# Patient Record
Sex: Male | Born: 1975
Health system: Southern US, Community
[De-identification: ages and names within clinical notes are randomized; demographics above are authoritative.]

## PROBLEM LIST (undated history)

## (undated) DIAGNOSIS — I10 Essential (primary) hypertension: Secondary | ICD-10-CM

## (undated) DIAGNOSIS — F419 Anxiety disorder, unspecified: Secondary | ICD-10-CM

## (undated) DIAGNOSIS — T7840XA Allergy, unspecified, initial encounter: Secondary | ICD-10-CM

## (undated) DIAGNOSIS — E785 Hyperlipidemia, unspecified: Secondary | ICD-10-CM

## (undated) DIAGNOSIS — K219 Gastro-esophageal reflux disease without esophagitis: Secondary | ICD-10-CM

## (undated) HISTORY — DX: Anxiety disorder, unspecified: F41.9

## (undated) HISTORY — PX: OTHER SURGICAL HISTORY: SHX169

## (undated) HISTORY — DX: Gastro-esophageal reflux disease without esophagitis: K21.9

## (undated) HISTORY — DX: Hyperlipidemia, unspecified: E78.5

## (undated) HISTORY — DX: Allergy, unspecified, initial encounter: T78.40XA

## (undated) HISTORY — DX: Essential (primary) hypertension: I10

---

## 2010-07-05 ENCOUNTER — Ambulatory Visit: Payer: Self-pay | Admitting: Internal Medicine

## 2010-07-05 DIAGNOSIS — F17200 Nicotine dependence, unspecified, uncomplicated: Secondary | ICD-10-CM

## 2010-07-05 DIAGNOSIS — K644 Residual hemorrhoidal skin tags: Secondary | ICD-10-CM | POA: Insufficient documentation

## 2010-07-05 LAB — CONVERTED CEMR LAB
Cholesterol: 210 mg/dL — ABNORMAL HIGH (ref 0–200)
HDL: 41.9 mg/dL (ref >39.00)
Triglycerides: 187 mg/dL — ABNORMAL HIGH (ref 0.0–149.0)
VLDL: 37.4 mg/dL (ref 0.0–40.0)

## 2010-07-06 ENCOUNTER — Encounter: Payer: Self-pay | Admitting: Internal Medicine

## 2010-08-26 NOTE — Assessment & Plan Note (Signed)
Summary: NEW / [facility] / # cd   Vital Signs:  Patient profile:   35 year old male Height:      72 inches Weight:      179 pounds BMI:     24.36 O2 Sat:      96 % on Room air Temp:     98.4 degrees F oral Pulse rate:   73 / minute Pulse rhythm:   regular Resp:     16 per minute BP sitting:   116 / 80  (left arm) Cuff size:   regular  Vitals Entered By: Bill Salinas CMA (July 05, 2010 1:34 PM)  O2 Flow:  Room air CC: new pt here to est care with primary, Preventive Care   Primary Care Provider:  Yetta Barre  CC:  new pt here to est care with primary and Preventive Care.  History of Present Illness: New to me for a complete physical - no complaints today.  Preventive Screening-Counseling & Management  Alcohol-Tobacco     Alcohol drinks/day: 0     Alcohol Counseling: not indicated; patient does not drink     Smoking Status: current     Smoking Cessation Counseling: yes     Smoke Cessation Stage: precontemplative     Packs/Day: 3 to 4 cigs a day     Year Started: 1991     Pack years: 5     Tobacco Counseling: to quit use of tobacco products  Caffeine-Diet-Exercise     Caffeine use/day: 3 cups per day     Does Patient Exercise: yes  Hep-HIV-STD-Contraception     Hepatitis Risk: no risk noted     HIV Risk: no risk noted     STD Risk: no risk noted     Dental Visit-last 6 months yes     Dental Care Counseling: to seek dental care; no dental care within six months     TSE monthly: yes     Testicular SE Education/Counseling to perform regular STE     Sun Exposure-Excessive: no     Sun Exposure Counseling: not indicated; sun exposure is acceptable  Safety-Violence-Falls     Seat Belt Use: yes     Helmet Use: n/a     Firearms in the Home: no firearms in the home     Smoke Detectors: yes     Violence in the Home: no risk noted     Sexual Abuse: no      Sexual History:  currently monogamous.        Drug Use:  no.        Blood Transfusions:  no.    Clinical  Review Panels:  Immunizations   Last Tetanus Booster:  Tdap (07/05/2010)   Last Flu Vaccine:  Fluvax 3+ (07/05/2010)  Diabetes Management   Last Flu Vaccine:  Fluvax 3+ (07/05/2010)   Medications Prior to Update: 1)  None  Current Medications (verified): 1)  Multiple Vitamins  Tabs (Multiple Vitamin) .Marland Kitchen.. 1 Tablet Once A Day  Allergies (verified): No Known Drug Allergies  Past History:  Past Medical History: Unremarkable  Past Surgical History: Denies surgical history  Family History: none  Social History: Occupation: CSR Single Current Smoker Alcohol use-yes Drug use-no Regular exercise-yes Smoking Status:  current Packs/Day:  3 to 4 cigs a day Caffeine use/day:  3 cups per day Does Patient Exercise:  yes Sun Exposure-Excessive:  no Seat Belt Use:  yes Hepatitis Risk:  no risk noted HIV Risk:  no risk  noted STD Risk:  no risk noted Dental Care w/in 6 mos.:  yes Sexual History:  currently monogamous Blood Transfusions:  no Drug Use:  no  Review of Systems  The patient denies anorexia, fever, weight loss, weight gain, chest pain, syncope, dyspnea on exertion, peripheral edema, prolonged cough, headaches, hemoptysis, abdominal pain, melena, hematochezia, severe indigestion/heartburn, hematuria, muscle weakness, suspicious skin lesions, transient blindness, difficulty walking, depression, enlarged lymph nodes, angioedema, and testicular masses.    Physical Exam  General:  alert, well-developed, well-nourished, well-hydrated, appropriate dress, normal appearance, healthy-appearing, cooperative to examination, and good hygiene.   Head:  normocephalic, atraumatic, no abnormalities observed, and no abnormalities palpated.   Eyes:  vision grossly intact, pupils equal, pupils round, pupils reactive to light, and no injection.   Ears:  R ear normal and L ear normal.   Nose:  External nasal examination shows no deformity or inflammation. Nasal mucosa are pink and  moist without lesions or exudates. Mouth:  Oral mucosa and oropharynx without lesions or exudates.  Teeth in good repair. Neck:  supple, full ROM, no masses, no thyromegaly, no thyroid nodules or tenderness, no JVD, normal carotid upstroke, no carotid bruits, no cervical lymphadenopathy, and no neck tenderness.   Breasts:  no gynecomastia, no masses, and no adenopathy.   Lungs:  normal respiratory effort, no intercostal retractions, no accessory muscle use, normal breath sounds, no dullness, no fremitus, no crackles, and no wheezes.   Heart:  normal rate, regular rhythm, no murmur, no gallop, no rub, and no JVD.   Abdomen:  soft, non-tender, normal bowel sounds, no distention, no masses, no guarding, no rigidity, no rebound tenderness, no abdominal hernia, no inguinal hernia, no hepatomegaly, and no splenomegaly.   Rectal:  no external abnormalities, normal sphincter tone, no masses, no tenderness, no fissures, no fistulae, no perianal rash, and external hemorrhoid(s).   Genitalia:  circumcised, no hydrocele, no varicocele, no scrotal masses, no testicular masses or atrophy, no cutaneous lesions, and no urethral discharge.   Prostate:  Prostate gland firm and smooth, no enlargement, nodularity, tenderness, mass, asymmetry or induration. Msk:  normal ROM, no joint tenderness, no joint swelling, no joint warmth, no redness over joints, no joint deformities, no joint instability, and no crepitation.   Pulses:  R and L carotid,radial,femoral,dorsalis pedis and posterior tibial pulses are full and equal bilaterally Extremities:  No clubbing, cyanosis, edema, or deformity noted with normal full range of motion of all joints.   Neurologic:  No cranial nerve deficits noted. Station and gait are normal. Plantar reflexes are down-going bilaterally. DTRs are symmetrical throughout. Sensory, motor and coordinative functions appear intact. Skin:  turgor normal, color normal, no rashes, no suspicious lesions, no  ecchymoses, no petechiae, no purpura, no ulcerations, and no edema.   Cervical Nodes:  no anterior cervical adenopathy and no posterior cervical adenopathy.   Axillary Nodes:  no R axillary adenopathy and no L axillary adenopathy.   Inguinal Nodes:  no R inguinal adenopathy and no L inguinal adenopathy.   Psych:  Cognition and judgment appear intact. Alert and cooperative with normal attention span and concentration. No apparent delusions, illusions, hallucinations   Impression & Recommendations:  Problem # 1:  ROUTINE GENERAL MEDICAL EXAM@HEALTH  CARE FACL (ICD-V70.0) Assessment New  Orders: Venipuncture (30865) TLB-Lipid Panel (80061-LIPID)  Td Booster: Tdap (07/05/2010)   Flu Vax: Fluvax 3+ (07/05/2010)    Discussed using sunscreen, use of alcohol, drug use, self testicular exam, routine dental care, routine eye care, routine physical exam,  seat belts, multiple vitamins,  and recommendations for immunizations.  Discussed exercise and checking cholesterol.  Discussed gun safety, safe sex, and contraception.   Complete Medication List: 1)  Multiple Vitamins Tabs (Multiple vitamin) .Marland Kitchen.. 1 tablet once a day  Other Orders: Tdap => 74yrs IM (91478) Admin 1st Vaccine (29562) Flu Vaccine 33yrs + (13086) TB Skin Test [id number]) Admin of Any Addtl Vaccine (96295) Admin of Any Addtl Vaccine (28413)  Colorectal Screening:  Current Recommendations:    Hemoccult: NEG X 1 today  Immunization & Chemoprophylaxis:    Tetanus vaccine: Tdap  (07/05/2010)    Influenza vaccine: Fluvax 3+  (07/05/2010)  Patient Instructions: 1)  Please schedule a follow-up appointment in 2 months. 2)  Tobacco is very bad for your health and your loved ones! You Should stop smoking!. 3)  Stop Smoking Tips: Choose a Quit date. Cut down before the Quit date. decide what you will do as a substitute when you feel the urge to smoke(gum,toothpick,exercise). 4)  It is important that you exercise regularly at least 20  minutes 5 times a week. If you develop chest pain, have severe difficulty breathing, or feel very tired , stop exercising immediately and seek medical attention. 5)  You need to lose weight. Consider a lower calorie diet and regular exercise.    Orders Added: 1)  Venipuncture [24401] 2)  TLB-Lipid Panel [80061-LIPID] 3)  Tdap => 31yrs IM [90715] 4)  Admin 1st Vaccine [90471] 5)  Flu Vaccine 80yrs + [02725] 6)  TB Skin Test [86580] 7)  Admin of Any Addtl Vaccine [90472] 8)  Admin of Any Addtl Vaccine [90472] 9)  New Patient 18-39 years [99385]   Immunizations Administered:  Tetanus Vaccine:    Vaccine Type: Tdap    Site: left deltoid    Mfr: GlaxoSmithKline    Dose: 0.5 ml    Route: IM    Given by: Ami Bullins CMA    Exp. Date: 05/13/2012    Lot #: [id number]    VIS given: 06/11/08 version given July 05, 2010.  Influenza Vaccine # 1:    Vaccine Type: Fluvax 3+    Site: right deltoid    Mfr: Sanofi Pasteur    Dose: 0.5 ml    Route: IM    Given by: Ami Bullins CMA    Exp. Date: 01/22/2011    Lot #: [id number]    VIS given: 02/16/10 version given July 05, 2010.  PPD Skin Test:    Vaccine Type: PPD    Site: left forearm    Mfr: Sanofi Pasteur    Dose: 0.1 ml    Route: ID    Given by: Ami Bullins CMA    Exp. Date: 05/27/2011    Lot #: [id number]  Flu Vaccine Consent Questions:    Do you have a history of severe allergic reactions to this vaccine? no    Any prior history of allergic reactions to egg and/or gelatin? no    Do you have a sensitivity to the preservative Thimersol? no    Do you have a past history of Guillan-Barre Syndrome? no    Do you currently have an acute febrile illness? no    Have you ever had a severe reaction to latex? no    Vaccine information given and explained to patient? yes   Immunizations Administered:  Tetanus Vaccine:    Vaccine Type: Tdap    Site: left deltoid    Mfr: GlaxoSmithKline    Dose: 0.5 ml  Route: IM    Given by:  Ami Bullins CMA    Exp. Date: 05/13/2012    Lot #: [id number]    VIS given: 06/11/08 version given July 05, 2010.  Influenza Vaccine # 1:    Vaccine Type: Fluvax 3+    Site: right deltoid    Mfr: Sanofi Pasteur    Dose: 0.5 ml    Route: IM    Given by: Ami Bullins CMA    Exp. Date: 01/22/2011    Lot #: [id number]    VIS given: 02/16/10 version given July 05, 2010.  PPD Skin Test:    Vaccine Type: PPD    Site: left forearm    Mfr: Sanofi Pasteur    Dose: 0.1 ml    Route: ID    Given by: Ami Bullins CMA    Exp. Date: 05/27/2011    Lot #: [id number]

## 2010-08-26 NOTE — Letter (Signed)
Summary: Lipid Letter  [facility]  [address] [place], [place] [address]   Phone: [phone]  Fax: [phone]    07/06/2010  James Leon [address] [place], [place]  [address]  Dear James Leon:  We have carefully reviewed your last lipid profile from  and the results are noted below with a summary of recommendations for lipid management.    Cholesterol:       210     Goal: <200   HDL "good" Cholesterol:   57.84     Goal: >40   LDL "bad" Cholesterol:   127     Goal: <130   Triglycerides:       187.0     Goal: <150 wow        TLC Diet (Therapeutic Lifestyle Change): Saturated Fats & Transfatty acids should be kept < 7% of total calories ***Reduce Saturated Fats Polyunstaurated Fat can be up to 10% of total calories Monounsaturated Fat Fat can be up to 20% of total calories Total Fat should be no greater than 25-35% of total calories Carbohydrates should be 50-60% of total calories Protein should be approximately 15% of total calories Fiber should be at least 20-30 grams a day ***Increased fiber may help lower LDL Total Cholesterol should be < 200mg /day Consider adding plant stanol/sterols to diet (example: Benacol spread) ***A higher intake of unsaturated fat may reduce Triglycerides and Increase HDL    Adjunctive Measures (may lower LIPIDS and reduce risk of Heart Attack) include: Aerobic Exercise (20-30 minutes 3-4 times a week) Limit Alcohol Consumption Weight Reduction Aspirin 75-81 mg a day by mouth (if not allergic or contraindicated) Dietary Fiber 20-30 grams a day by mouth     Current Medications: 1)    Multiple Vitamins  Tabs (Multiple vitamin) .Marland Kitchen.. 1 tablet once a day  If you have any questions, please call. We appreciate being able to work with you.   Sincerely,    [facility]

## 2015-01-05 ENCOUNTER — Encounter: Payer: Self-pay | Admitting: Family

## 2015-01-05 ENCOUNTER — Ambulatory Visit (INDEPENDENT_AMBULATORY_CARE_PROVIDER_SITE_OTHER): Payer: 59 | Admitting: Family

## 2015-01-05 VITALS — BP 110/78 | HR 72 | Temp 98.6°F | Resp 18 | Ht 72.0 in | Wt 196.6 lb

## 2015-01-05 DIAGNOSIS — G479 Sleep disorder, unspecified: Secondary | ICD-10-CM | POA: Diagnosis not present

## 2015-01-05 DIAGNOSIS — F411 Generalized anxiety disorder: Secondary | ICD-10-CM | POA: Diagnosis not present

## 2015-01-05 DIAGNOSIS — K219 Gastro-esophageal reflux disease without esophagitis: Secondary | ICD-10-CM | POA: Diagnosis not present

## 2015-01-05 MED ORDER — LORAZEPAM 0.5 MG PO TABS
ORAL_TABLET | ORAL | Status: DC
Start: 2015-01-05 — End: 2015-02-02

## 2015-01-05 MED ORDER — RANITIDINE HCL 150 MG PO CAPS: 150.0000 mg | ORAL_CAPSULE | Freq: Two times a day (BID) | ORAL | Status: DC

## 2015-01-05 NOTE — Assessment & Plan Note (Signed)
Stable with current dosage of Zantac. Denies adverse side effects. Continue current dosage of Zantac.

## 2015-01-05 NOTE — Progress Notes (Signed)
Subjective:    Patient ID: James Leon, male    DOB: 01/27/1976, 39 y.o.   MRN: [id number]  Chief Complaint  Patient presents with  . Establish Care    Has issues with hearburn often, and has issues with getting splotches on his back that itch with heat and sweat    HPI:  James Leon is a 39 y.o. male with a PMH of external hemorrhoids and tobacco use who presents today for an office visit to establish care.  1.) Reflux - Previously diagnosed with GERD/reflux and is currently maintained on Zantac. Takes the medication as directed and denies any adverse side effects. Reports that his symptoms are controlled with the current dosage. Notes that when his weight is increased he tends to have more symptoms.   2.) Decreased patience - Associated symptom of increased stress and decreased patience has been going on for several months . Recently moved up to [place] and then back to [place] following some family issues with his family. Now is about to start a new job. Is hoping that exercise is helping to decrease his stress. Generally believed to be laid back but notes a shorter temper.  3.) Sleep - Associated symptom of sleep disturbance has been going on for several years. Describes difficulty falling asleep but does not have any issues with staying asleep. Notes that he has "problems shutting off" and his mind wanders. Does not currently have a sleep routine. Has tried one of his parent's Ambien which did seem to help. Has tried over the counter Tylenol PM and melatonin which did not seem to help very much. Gets about 5-6 hours of sleep and feels rested in the morning without any feelings of fatigue.   No Known Allergies   No outpatient prescriptions prior to visit.   No facility-administered medications prior to visit.     Past Medical History  Diagnosis Date  . Allergy   . Hypertension   . Hyperlipidemia   . GERD (gastroesophageal reflux disease)      History reviewed. No pertinent past  surgical history.   Family History  Problem Relation Age of Onset  . Hyperlipidemia Mother   . Hypertension Mother   . Hyperlipidemia Father   . Heart disease Father   . Depression Father   . Depression Brother   . Prostate cancer Paternal Grandfather      History   Social History  . Marital Status: Married    Spouse Name: N/A  . Number of Children: 3  . Years of Education: 12   Occupational History  . Sales    Social History Main Topics  . Smoking status: Former Research scientist (life sciences)  . Smokeless tobacco: Never Used  . Alcohol Use: 0.0 oz/week    0 Standard drinks or equivalent per week     Comment: occasionally  . Drug Use: Yes    Special: Marijuana  . Sexual Activity: Not on file   Other Topics Concern  . Not on file   Social History Narrative   Fun: [employer] out with the kids and family time.   Denies any religious beliefs effecting health care.       Review of Systems  Gastrointestinal: Negative for nausea, vomiting, abdominal pain, constipation and abdominal distention.  Psychiatric/Behavioral: Positive for sleep disturbance. The patient is nervous/anxious.       Objective:    BP 110/78 mmHg  Pulse 72  Temp(Src) 98.6 F (37 C) (Oral)  Resp 18  Ht 6' (1.829 m)  Wt 196 lb 9.6 oz (89.177 kg)  BMI 26.66 kg/m2  SpO2 97% Nursing note and vital signs reviewed.  Physical Exam  Constitutional: He is oriented to person, place, and time. He appears well-developed and well-nourished. No distress.  Cardiovascular: Normal rate, regular rhythm, normal heart sounds and intact distal pulses.   Pulmonary/Chest: Effort normal and breath sounds normal.  Neurological: He is alert and oriented to person, place, and time.  Skin: Skin is warm and dry.  Psychiatric: He has a normal mood and affect. His behavior is normal. Judgment and thought content normal.       Assessment & Plan:   Problem List Items Addressed This Visit      Digestive   GERD (gastroesophageal reflux  disease) - Primary    Stable with current dosage of Zantac. Denies adverse side effects. Continue current dosage of Zantac.      Relevant Medications   ranitidine (ZANTAC) 150 MG capsule     Other   Anxiety state    Symptoms and exam consistent with anxiety. Start Ativan as needed for anxiety. Continue exercise as needed for stress relief. Follow up in 1 month to determine effectiveness.      Relevant Medications   LORazepam (ATIVAN) 0.5 MG tablet   Sleep disturbance    Sleep disturbance of many years that is refractory to melatonin and sleep hygiene. Start Ativan as needed for sleep. Information on sleep hygiene provided to patient. Follow-up in one month.      Relevant Medications   LORazepam (ATIVAN) 0.5 MG tablet

## 2015-01-05 NOTE — Progress Notes (Signed)
Pre visit review using our clinic review tool, if applicable. No additional management support is needed unless otherwise documented below in the visit note. 

## 2015-01-05 NOTE — Patient Instructions (Signed)
Thank you for choosing [facility].  Summary/Instructions:  Your prescription(s) have been submitted to your pharmacy or been printed and provided for you. Please take as directed and contact our office if you believe you are having problem(s) with the medication(s) or have any questions.  If your symptoms worsen or fail to improve, please contact our office for further instruction, or in case of emergency go directly to the emergency room at the closest medical facility.    Recommendations for improving sleep:   Avoid having pets sleep in the bedroom  Avoid caffeine consumption after 4pm  Keep bedroom cool and conducive to sleep  Avoid nicotine use, especially in the evening  Avoid exercise within 2-3 hours before bedtime  Stimulus Control:   Go to bed only when sleepy  Use the bedroom for sleep and sex only  Go to another room if you are unable to fall asleep within 15 to 20 minutes  Read or engage in other quiet activities and return to bed only when sleepy.  Generalized Anxiety Disorder Generalized anxiety disorder (GAD) is a mental disorder. It interferes with life functions, including relationships, work, and school. GAD is different from normal anxiety, which everyone experiences at some point in their lives in response to specific life events and activities. Normal anxiety actually helps [place] prepare for and get through these life events and activities. Normal anxiety goes away after the event or activity is over.  GAD causes anxiety that is not necessarily related to specific events or activities. It also causes excess anxiety in proportion to specific events or activities. The anxiety associated with GAD is also difficult to control. GAD can vary from mild to severe. People with severe GAD can have intense waves of anxiety with physical symptoms (panic attacks).  SYMPTOMS The anxiety and worry associated with GAD are difficult to control. This anxiety and worry  are related to many life events and activities and also occur more days than not for 6 months or longer. People with GAD also have three or more of the following symptoms (one or more in children):  Restlessness.   Fatigue.  Difficulty concentrating.   Irritability.  Muscle tension.  Difficulty sleeping or unsatisfying sleep. DIAGNOSIS GAD is diagnosed through an assessment by your health care provider. Your health care provider will ask you questions aboutyour mood,physical symptoms, and events in your life. Your health care provider may ask you about your medical history and use of alcohol or drugs, including prescription medicines. Your health care provider may also do a physical exam and blood tests. Certain medical conditions and the use of certain substances can cause symptoms similar to those associated with GAD. Your health care provider may refer you to a mental health specialist for further evaluation. TREATMENT The following therapies are usually used to treat GAD:   Medication. Antidepressant medication usually is prescribed for long-term daily control. Antianxiety medicines may be added in severe cases, especially when panic attacks occur.   Talk therapy (psychotherapy). Certain types of talk therapy can be helpful in treating GAD by providing support, education, and guidance. A form of talk therapy called cognitive behavioral therapy can teach you healthy ways to think about and react to daily life events and activities.  Stress managementtechniques. These include yoga, meditation, and exercise and can be very helpful when they are practiced regularly. A mental health specialist can help determine which treatment is best for you. Some people see improvement with one therapy. However, other people require a  combination of therapies. Document Released: 11/05/2012 Document Revised: 11/25/2013 Document Reviewed: 11/05/2012 [facility] Patient Information [address], [place].  This information is not intended to replace advice given to you by your health care provider. Make sure you discuss any questions you have with your health care provider.

## 2015-01-05 NOTE — Assessment & Plan Note (Signed)
Symptoms and exam consistent with anxiety. Start Ativan as needed for anxiety. Continue exercise as needed for stress relief. Follow up in 1 month to determine effectiveness.

## 2015-01-05 NOTE — Assessment & Plan Note (Signed)
Sleep disturbance of many years that is refractory to melatonin and sleep hygiene. Start Ativan as needed for sleep. Information on sleep hygiene provided to patient. Follow-up in one month.

## 2015-02-02 ENCOUNTER — Other Ambulatory Visit: Payer: Self-pay | Admitting: Family

## 2015-02-25 ENCOUNTER — Other Ambulatory Visit: Payer: Self-pay | Admitting: Family

## 2015-02-26 NOTE — Telephone Encounter (Signed)
Please advise, thanks.

## 2015-02-26 NOTE — Telephone Encounter (Signed)
rx sent by taylor/cma

## 2015-04-23 ENCOUNTER — Other Ambulatory Visit: Payer: Self-pay | Admitting: Family

## 2015-04-24 NOTE — Telephone Encounter (Signed)
Pt requesting refill. Las refill was 8/4 with 60 as quantity

## 2015-04-27 ENCOUNTER — Ambulatory Visit (INDEPENDENT_AMBULATORY_CARE_PROVIDER_SITE_OTHER): Payer: 59 | Admitting: Family

## 2015-04-27 ENCOUNTER — Encounter: Payer: Self-pay | Admitting: Family

## 2015-04-27 VITALS — BP 120/84 | HR 75 | Temp 97.8°F | Resp 18 | Ht 72.0 in | Wt 192.1 lb

## 2015-04-27 DIAGNOSIS — G479 Sleep disorder, unspecified: Secondary | ICD-10-CM

## 2015-04-27 DIAGNOSIS — M216X9 Other acquired deformities of unspecified foot: Secondary | ICD-10-CM | POA: Diagnosis not present

## 2015-04-27 DIAGNOSIS — Z23 Encounter for immunization: Secondary | ICD-10-CM | POA: Diagnosis not present

## 2015-04-27 MED ORDER — LORAZEPAM 1 MG PO TABS: ORAL_TABLET | ORAL | Status: DC

## 2015-04-27 NOTE — Assessment & Plan Note (Signed)
Foot pain most likely related to over pronation of bilateral feet secondary to functional pes planus. Treat conservatively with ice/ice massage as needed for discomfort combined with gastrocnemius stretching and stretching of the flexor digitorum tendons. Refer to sports medicine for potential orthotics. Follow-up if symptoms worsen or fail to improve prior to then.

## 2015-04-27 NOTE — Progress Notes (Signed)
Pre visit review using our clinic review tool, if applicable. No additional management support is needed unless otherwise documented below in the visit note. 

## 2015-04-27 NOTE — Assessment & Plan Note (Signed)
Continues to experience sleep disturbance with current regimen of lorazepam. Increase lorazepam at night as needed for sleep. Continue with lorazepam as needed during the day for anxiety. Follow-up in one month or sooner to determine effectiveness of intervention.

## 2015-04-27 NOTE — Progress Notes (Signed)
Subjective:    Patient ID: James Leon, male    DOB: 04-02-1976, 39 y.o.   MRN: [id number]  Chief Complaint  Patient presents with  . Follow-up    states that taking 2 ativan does not help with sleep wants to try another option, did not pick up refill yet    HPI:  James Leon is a 39 y.o. male who  has a past medical history of Allergy; Hypertension; Hyperlipidemia; and GERD (gastroesophageal reflux disease). and presents today for an office follow up.   1.) Sleep disturbance - Currently maintained on 1 mg of Ativan as needed for sleep. Reports approximately 4-5 hours of sleep with the current regimen and notes the he has tried up to 1.5 mg which has helped a little. Believes that he is in need of additional medication or change of medication to improve his sleep.    2.) Feet - This is a new problem. Associated symptom of pain located in bottom of his left foot has been going on for about for about 4 months and has been refractory to shoe inserts which have helped very little. Describes that his job has increased the amount of walking. Pain is worst in the morning and eases with motion and described as stretching. Denies any trauma to the area or any sounds/sensations heard or felt.    No Known Allergies   Current Outpatient Prescriptions on File Prior to Visit  Medication Sig Dispense Refill  . ranitidine (ZANTAC) 150 MG capsule Take 1 capsule (150 mg total) by mouth 2 (two) times daily. 60 capsule 5   No current facility-administered medications on file prior to visit.    Past Medical History  Diagnosis Date  . Allergy   . Hypertension   . Hyperlipidemia   . GERD (gastroesophageal reflux disease)      No past surgical history on file.   Review of Systems  Musculoskeletal:       Positive for foot pain   Psychiatric/Behavioral: Positive for sleep disturbance.      Objective:    BP 120/84 mmHg  Pulse 75  Temp(Src) 97.8 F (36.6 C) (Oral)  Resp 18  Ht 6'  (1.829 m)  Wt 192 lb 1.9 oz (87.145 kg)  BMI 26.05 kg/m2  SpO2 97% Nursing note and vital signs reviewed.  Physical Exam  Constitutional: He is oriented to person, place, and time. He appears well-developed and well-nourished. No distress.  Cardiovascular: Normal rate, regular rhythm, normal heart sounds and intact distal pulses.   Pulmonary/Chest: Effort normal and breath sounds normal.  Musculoskeletal:  Bilateral feet - no obvious deformity, discoloration, or edema noted. Mild tenderness noted along the base of calcaneus. Dorsiflexion is slightly limited secondary to gastrocnemius tightness. Pulses are intact and appropriate. Gait evaluation reveals overpronation bilaterally with functional pes planus.  Neurological: He is alert and oriented to person, place, and time.  Skin: Skin is warm and dry.  Psychiatric: He has a normal mood and affect. His behavior is normal. Judgment and thought content normal.       Assessment & Plan:   Problem List Items Addressed This Visit      Other   Sleep disturbance    Continues to experience sleep disturbance with current regimen of lorazepam. Increase lorazepam at night as needed for sleep. Continue with lorazepam as needed during the day for anxiety. Follow-up in one month or sooner to determine effectiveness of intervention.      Relevant Medications   LORazepam (ATIVAN)  1 MG tablet   Pronation of feet - Primary    Foot pain most likely related to over pronation of bilateral feet secondary to functional pes planus. Treat conservatively with ice/ice massage as needed for discomfort combined with gastrocnemius stretching and stretching of the flexor digitorum tendons. Refer to sports medicine for potential orthotics. Follow-up if symptoms worsen or fail to improve prior to then.      Relevant Orders   Ambulatory referral to Sports Medicine

## 2015-04-27 NOTE — Patient Instructions (Signed)
Thank you for choosing [facility].  Summary/Instructions:  Your prescription(s) have been submitted to your pharmacy or been printed and provided for you. Please take as directed and contact our office if you believe you are having problem(s) with the medication(s) or have any questions.  If your symptoms worsen or fail to improve, please contact our office for further instruction, or in case of emergency go directly to the emergency room at the closest medical facility.    For your feet - recommend either a raquetball or golf ball for ice massage of your foot. Advil or Aleve as needed. Follow up for orthotics.

## 2015-05-18 ENCOUNTER — Encounter: Payer: Self-pay | Admitting: Family

## 2015-05-27 ENCOUNTER — Ambulatory Visit (INDEPENDENT_AMBULATORY_CARE_PROVIDER_SITE_OTHER): Payer: 59 | Admitting: Family Medicine

## 2015-05-27 ENCOUNTER — Encounter: Payer: Self-pay | Admitting: Family Medicine

## 2015-05-27 ENCOUNTER — Other Ambulatory Visit (INDEPENDENT_AMBULATORY_CARE_PROVIDER_SITE_OTHER): Payer: 59

## 2015-05-27 VITALS — BP 128/78 | HR 89 | Ht 72.0 in | Wt 191.0 lb

## 2015-05-27 DIAGNOSIS — M79672 Pain in left foot: Secondary | ICD-10-CM

## 2015-05-27 DIAGNOSIS — M722 Plantar fascial fibromatosis: Secondary | ICD-10-CM | POA: Diagnosis not present

## 2015-05-27 NOTE — Patient Instructions (Signed)
Good to see you.  Ice bath for 10-20 minutes at night Good shoes with rigid bottom.  James Leon, [place]rell or New balance greater then 700 [employer]en[employer] orthotics online "total support" pennsaid pinkie amount topically 2 times daily as needed.  Exercises 3 times a week.  See me again in 3-4 weeks.

## 2015-05-27 NOTE — Progress Notes (Signed)
Pre visit review using our clinic review tool, if applicable. No additional management support is needed unless otherwise documented below in the visit note. 

## 2015-05-27 NOTE — Assessment & Plan Note (Signed)
Plantar Fascitis: We reviewed that stretching is critically important to the treatment of PF. Reviewed footwear. Rigid soles have been shown to help with PF. Reviewed rehab of stretching and calf raises.  Could benefit from a corticosteroid injection, orthotics, or other measures if conservative treatment fails. RTC in 4 weeks. Likely will not need custom orthotics but is an option.

## 2015-05-27 NOTE — Progress Notes (Signed)
[facility] [place], [place] [address] Phone: [phone] Subjective:    I'm seeing this patient by the request  of:  Mauricio Po, FNP   CC: Bilateral foot discomfort  [id number] James Leon is a 39 y.o. male coming in with complaint of Foot pain. Patient states that it seems more on the left foot than the right foot. Hasn't been told these had flatfeet previously. Patient states that it started with worsening pain in the mornings and in now it seems to be more with activity. States that it seems to have gotten better since he's been wearing over-the-counter orthotics as well as better shoes. Patient has not been taking any medications. Denies any numbness or weakness. Patient has skiing weight over the course of time and thinks that this could be contributing as well. Denies any nighttime awakening. Patient though is now standing a lot more work than what he was doing previously mostly on cement floor.     Past Medical History  Diagnosis Date  . Allergy   . Hypertension   . Hyperlipidemia   . GERD (gastroesophageal reflux disease)    No past surgical history on file. Social History  Substance Use Topics  . Smoking status: Former Research scientist (life sciences)  . Smokeless tobacco: Never Used  . Alcohol Use: 0.0 oz/week    0 Standard drinks or equivalent per week     Comment: occasionally   No Known Allergies Family History  Problem Relation Age of Onset  . Hyperlipidemia Mother   . Hypertension Mother   . Hyperlipidemia Father   . Heart disease Father   . Depression Father   . Depression Brother   . Prostate cancer Paternal Grandfather      Past medical history, social, surgical and family history all reviewed in electronic medical record.   Review of Systems: No headache, visual changes, nausea, vomiting, diarrhea, constipation, dizziness, abdominal pain, skin rash, fevers, chills, night sweats, weight loss, swollen lymph nodes, body aches,  joint swelling, muscle aches, chest pain, shortness of breath, mood changes.   Objective Blood pressure 128/78, pulse 89, height 6' (1.829 m), weight 191 lb (86.637 kg), SpO2 96 %.  General: No apparent distress alert and oriented x3 mood and affect normal, dressed appropriately.  HEENT: Pupils equal, extraocular movements intact  Respiratory: Patient's speak in full sentences and does not appear short of breath  Cardiovascular: No lower extremity edema, non tender, no erythema  Skin: Warm dry intact with no signs of infection or rash on extremities or on axial skeleton.  Abdomen: Soft nontender  Neuro: Cranial nerves II through XII are intact, neurovascularly intact in all extremities with 2+ DTRs and 2+ pulses.  Lymph: No lymphadenopathy of posterior or anterior cervical chain or axillae bilaterally.  Gait normal with good balance and coordination.  MSK:  Non tender with full range of motion and good stability and symmetric strength and tone of shoulders, elbows, wrist, hip, knee and ankles bilaterally.  Foot exam shows the patient does have mild pes planus bilaterally but patient does have a dynamic arch. Mild overpronation of the hindfoot bilaterally. Neural heel with a wide forefoot. Nontender on exam   MSK US performed of: Left foot and ankle This study was ordered, performed, and interpreted by Charlann Boxer D.O.  Foot/Ankle:   All structures visualized.   Talar dome unremarkable  Ankle mortise without effusion. Peroneus longus and brevis tendons unremarkable on long and transverse views without sheath effusions. Posterior tibialis,  flexor hallucis longus, and flexor digitorum longus tendons unremarkable on long and transverse views without sheath effusions. Achilles tendon visualized along length of tendon and unremarkable on long and transverse views without sheath effusion. Anterior Talofibular Ligament and Calcaneofibular Ligaments unremarkable and intact. Deltoid Ligament  unremarkable and intact. Plantar fascia intact with minimal effusion noted and mild enlargement and 0.86 cm. Power doppler signal normal.  IMPRESSION:  Mild plantar fasciitis  Procedure note 97110; 15 minutes spent for Therapeutic exercises as stated in above notes.  This included exercises focusing on stretching, strengthening, with significant focus on eccentric aspects.Stretches to help lengthen the lower leg and plantar fascia areas Theraband exercises for the lower leg and ankle to help strengthen the surrounding area- dorsiflexion, plantarflexion, inversion, eversion Massage rolling on the plantar surface of the foot with a frozen bottle, tennis ball or golf ball Towel or marble pick-ups to strengthen the plantar surface of the foot Weight bearing exercises to increase balance and overall stability   Proper technique shown and discussed handout in great detail with ATC.  All questions were discussed and answered.        Impression and Recommendations:     This case required medical decision making of moderate complexity.

## 2015-06-08 ENCOUNTER — Encounter: Payer: Self-pay | Admitting: Family

## 2015-06-09 MED ORDER — ALPRAZOLAM 1 MG PO TABS: 1.0000 mg | ORAL_TABLET | Freq: Two times a day (BID) | ORAL | Status: DC | PRN

## 2015-06-17 ENCOUNTER — Ambulatory Visit: Payer: 59 | Admitting: Family Medicine

## 2015-06-25 ENCOUNTER — Other Ambulatory Visit: Payer: Self-pay | Admitting: Family

## 2015-06-25 ENCOUNTER — Encounter: Payer: Self-pay | Admitting: Family

## 2015-06-30 ENCOUNTER — Telehealth: Payer: Self-pay | Admitting: Family

## 2015-06-30 MED ORDER — ALPRAZOLAM 1 MG PO TABS: 1.0000 mg | ORAL_TABLET | Freq: Two times a day (BID) | ORAL | Status: DC | PRN

## 2015-06-30 NOTE — Telephone Encounter (Signed)
Medication printed for faxing.  

## 2015-06-30 NOTE — Telephone Encounter (Signed)
Last refill was 11/15.

## 2015-06-30 NOTE — Telephone Encounter (Signed)
Pt requesting refill request for ALPRAZolam Duanne Moron) 1 MG tablet [id number] Pharmacy is [facility] outpatient. He also sent you an email on 12/1

## 2015-07-06 ENCOUNTER — Ambulatory Visit: Payer: 59 | Admitting: Family Medicine

## 2015-07-23 ENCOUNTER — Other Ambulatory Visit: Payer: Self-pay | Admitting: Family

## 2015-07-23 NOTE — Telephone Encounter (Signed)
Last refill was 12/6. Quantity 30. BID

## 2015-08-10 MED FILL — ALPRAZolam 1 MG TABS: 1 | 15 days supply | Qty: 30 | Fill #1

## 2015-08-26 MED FILL — ALPRAZolam 1 MG TABS: 1 | 15 days supply | Qty: 30 | Fill #2

## 2015-09-10 MED FILL — ALPRAZolam 1 MG TABS: 1 | 15 days supply | Qty: 30 | Fill #3

## 2015-09-24 MED FILL — ALPRAZolam 1 MG TABS: 1 | 15 days supply | Qty: 30 | Fill #4

## 2015-10-12 MED FILL — ALPRAZolam 1 MG TABS: 1 | 15 days supply | Qty: 30 | Fill #5

## 2015-10-29 ENCOUNTER — Other Ambulatory Visit: Payer: Self-pay | Admitting: Family

## 2015-10-30 MED FILL — ALPRAZolam 1 MG TABS: 1 | 15 days supply | Qty: 30 | Fill #0

## 2015-10-30 NOTE — Telephone Encounter (Signed)
Last refill was 07/24/15

## 2015-11-17 MED FILL — ALPRAZolam 1 MG TABS: 1 | 15 days supply | Qty: 30 | Fill #1

## 2015-12-03 MED FILL — ALPRAZolam 1 MG TABS: 1 | 15 days supply | Qty: 30 | Fill #2

## 2015-12-18 MED FILL — ALPRAZolam 1 MG TABS: 1 | 15 days supply | Qty: 30 | Fill #3

## 2015-12-31 MED FILL — ALPRAZolam 1 MG TABS: 1 | 15 days supply | Qty: 30 | Fill #4

## 2016-01-22 MED FILL — ALPRAZolam 1 MG TABS: 1 | 15 days supply | Qty: 30 | Fill #5

## 2016-02-05 ENCOUNTER — Other Ambulatory Visit: Payer: Self-pay | Admitting: Family

## 2016-02-09 ENCOUNTER — Other Ambulatory Visit: Payer: Self-pay | Admitting: Family

## 2016-02-09 MED ORDER — ALPRAZOLAM 1 MG PO TABS: 1.0000 mg | ORAL_TABLET | Freq: Two times a day (BID) | ORAL | Status: DC | PRN

## 2016-02-10 MED FILL — ALPRAZolam 1 MG TABS: 1 | 15 days supply | Qty: 30 | Fill #0

## 2016-02-10 NOTE — Telephone Encounter (Signed)
Script fax back to [facility]...Johny Chess

## 2016-02-25 MED FILL — ALPRAZolam 1 MG TABS: 1 | 15 days supply | Qty: 30 | Fill #1

## 2016-03-10 ENCOUNTER — Other Ambulatory Visit: Payer: Self-pay | Admitting: Family

## 2016-03-10 NOTE — Telephone Encounter (Signed)
Last refill was 02/09/16

## 2016-03-11 MED FILL — ALPRAZolam 1 MG TABS: 1 | 15 days supply | Qty: 30 | Fill #0

## 2016-03-11 NOTE — Telephone Encounter (Signed)
Faxed script back to [facility] pharmacy.../lmb 

## 2016-03-24 MED FILL — ALPRAZolam 1 MG TABS: 1 | 15 days supply | Qty: 30 | Fill #1

## 2016-04-07 ENCOUNTER — Other Ambulatory Visit: Payer: Self-pay | Admitting: Family

## 2016-04-07 MED FILL — ALPRAZolam 1 MG TABS: 1 | 15 days supply | Qty: 30 | Fill #0

## 2016-04-07 NOTE — Telephone Encounter (Signed)
Faxed script back to [facility] pharmacy.../lmb 

## 2016-04-20 MED FILL — ALPRAZolam 1 MG TABS: 1 | 15 days supply | Qty: 30 | Fill #1

## 2016-05-04 ENCOUNTER — Other Ambulatory Visit: Payer: Self-pay | Admitting: Family

## 2016-05-06 ENCOUNTER — Encounter: Payer: Self-pay | Admitting: Family

## 2016-05-06 ENCOUNTER — Ambulatory Visit (INDEPENDENT_AMBULATORY_CARE_PROVIDER_SITE_OTHER): Payer: 59 | Admitting: Family

## 2016-05-06 VITALS — BP 132/84 | HR 87 | Temp 98.6°F | Resp 16 | Ht 72.0 in | Wt 177.8 lb

## 2016-05-06 DIAGNOSIS — F411 Generalized anxiety disorder: Secondary | ICD-10-CM

## 2016-05-06 DIAGNOSIS — Z23 Encounter for immunization: Secondary | ICD-10-CM | POA: Diagnosis not present

## 2016-05-06 MED ORDER — ALPRAZOLAM 1 MG PO TABS: 1.0000 mg | ORAL_TABLET | Freq: Two times a day (BID) | ORAL | 0 refills | Status: DC | PRN

## 2016-05-06 MED FILL — ALPRAZolam 1 MG TABS: 1 | 30 days supply | Qty: 60 | Fill #0

## 2016-05-06 NOTE — Assessment & Plan Note (Signed)
Anxiety appears well controlled with current regimen and no adverse side effects. Continue current dosage of alprazolam. [place] Controlled Substance Database reviewed with no irregularities. Continue to monitor.

## 2016-05-06 NOTE — Progress Notes (Signed)
   Subjective:    Patient ID: James Leon, male    DOB: Nov 01, 1975, 40 y.o.   MRN: [id number]  Chief Complaint  Patient presents with  . Follow-up    med refill of xanax     HPI:  James Leon is a 40 y.o. male who  has a past medical history of Allergy; GERD (gastroesophageal reflux disease); Hyperlipidemia; and Hypertension. and presents today for an office visit.   Anxiety - Currently maintained on alprazolam and reports taking the medication as prescribed and denies adverse side effects. Notes that his symptoms are adequately controlled with the regimen. Using the alprazolam as needed and occasionally for sleep.   No Known Allergies    Outpatient Medications Prior to Visit  Medication Sig Dispense Refill  . ranitidine (ZANTAC) 150 MG capsule Take 1 capsule (150 mg total) by mouth 2 (two) times daily. 60 capsule 5  . ALPRAZolam (XANAX) 1 MG tablet TAKE 1 TABLET BY MOUTH TWICE DAILY AS NEEDED FOR ANXIETY 30 tablet 1   No facility-administered medications prior to visit.      Review of Systems  Constitutional: Negative for chills and fever.  Psychiatric/Behavioral: Negative for sleep disturbance and suicidal ideas. The patient is not nervous/anxious.       Objective:    BP 132/84 (BP Location: Left Arm, Patient Position: Sitting, Cuff Size: Normal)   Pulse 87   Temp 98.6 F (37 C) (Oral)   Resp 16   Ht 6' (1.829 m)   Wt 177 lb 12.8 oz (80.6 kg)   SpO2 97%   BMI 24.11 kg/m  Nursing note and vital signs reviewed.  Physical Exam  Constitutional: He is oriented to person, place, and time. He appears well-developed and well-nourished. No distress.  Cardiovascular: Normal rate, regular rhythm, normal heart sounds and intact distal pulses.   Pulmonary/Chest: Effort normal and breath sounds normal.  Neurological: He is alert and oriented to person, place, and time.  Skin: Skin is warm and dry.  Psychiatric: He has a normal mood and affect. His behavior is normal.  Judgment and thought content normal.       Assessment & Plan:   Problem List Items Addressed This Visit      Other   Anxiety state - Primary    Anxiety appears well controlled with current regimen and no adverse side effects. Continue current dosage of alprazolam. [place] Controlled Substance Database reviewed with no irregularities. Continue to monitor.       Relevant Medications   ALPRAZolam (XANAX) 1 MG tablet    Other Visit Diagnoses    Encounter for immunization       Relevant Orders   Flu Vaccine QUAD 36+ mos IM (Completed)       I have changed Mr. Mcardle ALPRAZolam. I am also having him maintain his ranitidine.   Meds ordered this encounter  Medications  . ALPRAZolam (XANAX) 1 MG tablet    Sig: Take 1 tablet (1 mg total) by mouth 2 (two) times daily as needed. for anxiety    Dispense:  60 tablet    Refill:  0    Order Specific Question:   Supervising Provider    Answer:   Pricilla Holm A [id number]     Follow-up: Return in about 3 months (around 08/06/2016), or if symptoms worsen or fail to improve.  Mauricio Po, FNP

## 2016-05-06 NOTE — Patient Instructions (Signed)
Thank you for choosing [facility].  SUMMARY AND INSTRUCTIONS:  Medication:  Please continue to take your medication as prescribed.  Your prescription(s) have been submitted to your pharmacy or been printed and provided for you. Please take as directed and contact our office if you believe you are having problem(s) with the medication(s) or have any questions.  Follow up:  If your symptoms worsen or fail to improve, please contact our office for further instruction, or in case of emergency go directly to the emergency room at the closest medical facility.      

## 2016-06-03 ENCOUNTER — Other Ambulatory Visit: Payer: Self-pay | Admitting: Family

## 2016-06-03 MED FILL — ALPRAZolam 1 MG TABS: 1 | 30 days supply | Qty: 60 | Fill #0

## 2016-06-03 NOTE — Telephone Encounter (Signed)
Medication printed to faxed.

## 2016-06-03 NOTE — Telephone Encounter (Signed)
Last refill was 05/06/16

## 2016-07-01 MED FILL — ALPRAZolam 1 MG TABS: 1 | 30 days supply | Qty: 60 | Fill #1

## 2016-07-29 ENCOUNTER — Other Ambulatory Visit: Payer: Self-pay | Admitting: Family

## 2016-07-29 MED FILL — ALPRAZolam 1 MG TABS: 1 | 30 days supply | Qty: 60 | Fill #0

## 2016-07-29 NOTE — Telephone Encounter (Signed)
Faxed

## 2016-07-29 NOTE — Telephone Encounter (Signed)
Last refill was 06/03/16

## 2016-08-26 MED FILL — ALPRAZolam 1 MG TABS: 1 | 30 days supply | Qty: 60 | Fill #1

## 2016-09-22 ENCOUNTER — Other Ambulatory Visit: Payer: Self-pay | Admitting: Family

## 2016-09-23 MED FILL — ALPRAZolam 1 MG TABS: 1 | 30 days supply | Qty: 60 | Fill #0

## 2016-09-23 NOTE — Telephone Encounter (Signed)
Faxed

## 2016-10-21 MED FILL — ALPRAZolam 1 MG TABS: 1 | 30 days supply | Qty: 60 | Fill #1

## 2016-11-22 ENCOUNTER — Other Ambulatory Visit: Payer: Self-pay | Admitting: Family

## 2016-11-22 NOTE — Telephone Encounter (Signed)
Faxed

## 2016-11-23 ENCOUNTER — Other Ambulatory Visit: Payer: Self-pay | Admitting: Family

## 2016-11-24 NOTE — Telephone Encounter (Signed)
Faxed

## 2016-11-28 ENCOUNTER — Encounter: Payer: Self-pay | Admitting: Family

## 2016-11-28 MED ORDER — ALPRAZOLAM 1 MG PO TABS: 1.0000 mg | ORAL_TABLET | Freq: Two times a day (BID) | ORAL | 1 refills | Status: DC | PRN

## 2016-11-29 MED FILL — ALPRAZolam 1 MG TABS: 1 | 30 days supply | Qty: 60 | Fill #0

## 2016-12-27 MED FILL — ALPRAZolam 1 MG TABS: 1 | 30 days supply | Qty: 60 | Fill #1

## 2017-01-20 ENCOUNTER — Other Ambulatory Visit: Payer: Self-pay | Admitting: Family

## 2017-01-23 NOTE — Telephone Encounter (Signed)
Faxed script back to [facility] pharmacy..../lmb  

## 2017-01-26 ENCOUNTER — Ambulatory Visit (INDEPENDENT_AMBULATORY_CARE_PROVIDER_SITE_OTHER): Payer: 59 | Admitting: Family Medicine

## 2017-01-26 ENCOUNTER — Ambulatory Visit (INDEPENDENT_AMBULATORY_CARE_PROVIDER_SITE_OTHER)
Admission: RE | Admit: 2017-01-26 | Discharge: 2017-01-26 | Disposition: A | Discharge status: Home / Self Care | Payer: 59 | Source: Ambulatory Visit | Attending: Family Medicine | Admitting: Family Medicine

## 2017-01-26 ENCOUNTER — Encounter: Payer: Self-pay | Admitting: Family Medicine

## 2017-01-26 ENCOUNTER — Other Ambulatory Visit: Payer: Self-pay | Admitting: Family Medicine

## 2017-01-26 VITALS — BP 118/78 | HR 64 | Temp 98.5°F | Wt 182.4 lb

## 2017-01-26 DIAGNOSIS — M79671 Pain in right foot: Secondary | ICD-10-CM | POA: Diagnosis not present

## 2017-01-26 DIAGNOSIS — M7989 Other specified soft tissue disorders: Secondary | ICD-10-CM | POA: Diagnosis not present

## 2017-01-26 MED ORDER — NAPROXEN 500 MG PO TABS: 500.0000 mg | ORAL_TABLET | Freq: Two times a day (BID) | ORAL | 0 refills | Status: DC

## 2017-01-26 MED FILL — NAPROXEN 500 MG TABLET: 500 | 15 days supply | Qty: 30 | Fill #0

## 2017-01-26 MED FILL — ALPRAZolam 1 MG TABS: 1 | 30 days supply | Qty: 60 | Fill #0

## 2017-01-26 NOTE — Progress Notes (Signed)
X-ray results indicate that pain may be due to gout. No prior diagnosis of gout; No HTN, Limited ETOH use; advised trial of Naproxen 500 mg BID with food and follow up with PCP for further evaluation and management.

## 2017-01-26 NOTE — Progress Notes (Addendum)
Subjective:    Patient ID: James Leon, male    DOB: Mar 26, 1976, 41 y.o.   MRN: [id number]  HPI  [name] is a 41 year old male who presents today with right foot/toe pain and swelling that occurred 2 days ago. He reports recently buying a new home and completing extensive yard work wearing "old shoes" that may have triggered this event. Treatment with ibuprofen (one dose) has provided excellent benefit. He denies fever, chills, sweats, erythema, ecchymosis, numbness, tingling, weakness, or inability to wear shoe. Pain is described as aching that is improved with ibuprofen. He states that he does not like to "come to the doctor" and is aware he needs to have a physical and lab work. No history of HTN, limited ETOH use. He is a daily smoker   Review of Systems  Constitutional: Negative for chills, fatigue and fever.  Respiratory: Negative for cough, shortness of breath and wheezing.   Cardiovascular: Negative for chest pain, palpitations and leg swelling.  Musculoskeletal:       Right foot/toe pain  Skin: Negative for color change and rash.   Past Medical History:  Diagnosis Date  . Allergy   . GERD (gastroesophageal reflux disease)   . Hyperlipidemia   . Hypertension      Social History   Social History  . Marital status: Married    Spouse name: N/A  . Number of children: 3  . Years of education: 12   Occupational History  . Sales    Social History Main Topics  . Smoking status: Former Research scientist (life sciences)  . Smokeless tobacco: Never Used  . Alcohol use 0.0 oz/week     Comment: occasionally  . Drug use: Yes    Types: Marijuana  . Sexual activity: Not on file   Other Topics Concern  . Not on file   Social History Narrative   Fun: James Leon out with the kids and family time.   Denies any religious beliefs effecting health care.     No past surgical history on file.  Family History  Problem Relation Age of Onset  . Hyperlipidemia Mother   . Hypertension Mother   .  Hyperlipidemia Father   . Heart disease Father   . Depression Father   . Depression Brother   . Prostate cancer Paternal Grandfather     No Known Allergies  Current Outpatient Prescriptions on File Prior to Visit  Medication Sig Dispense Refill  . ALPRAZolam (XANAX) 1 MG tablet TAKE 1 TABLET BY MOUTH 2 TIMES DAILY AS NEEDED FOR ANXIETY 60 tablet 1  . ranitidine (ZANTAC) 150 MG capsule Take 1 capsule (150 mg total) by mouth 2 (two) times daily. 60 capsule 5   No current facility-administered medications on file prior to visit.     BP 118/78 (BP Location: Left Arm, Patient Position: Sitting, Cuff Size: Normal)   Pulse 64   Temp 98.5 F (36.9 C) (Oral)   Wt 182 lb 6.4 oz (82.7 kg)   SpO2 98%   BMI 24.74 kg/m        Objective:   Physical Exam  Constitutional: He is oriented to person, place, and time. He appears well-developed and well-nourished.  Neck: Neck supple.  Cardiovascular: Normal rate, regular rhythm and intact distal pulses.   Pulmonary/Chest: Effort normal and breath sounds normal.  Musculoskeletal:  Minimal edema of right sided forefoot/great toe without erythema noted. Able to flex and extend toes with minimal discomfort noted with flexion. No ecchymosis present. No erythema or  edema of joint present. Cap refill <2 seconds, pedal pulses present  Lymphadenopathy:    He has no cervical adenopathy.  Neurological: He is alert and oriented to person, place, and time. Coordination normal.  Skin: Skin is warm and dry. No rash noted.  Psychiatric: He has a normal mood and affect. His behavior is normal. Judgment and thought content normal.       Assessment & Plan:  1. Right foot pain Exam is reassuring; extensive yard work and moving into new home may be a trigger; will obtain imaging to rule out fracture vs. Sprain; buddy taped toe for comfort; advised ibuprofen 600 mg every 6 hours for discomfort as needed; advised rigid sole shoes; ice and elevation; also discussed  possiblity of gout due to location, however exam did not clearly indicate this as a cause;further plan will be determined after review of X-ray. - DG Foot 2 Views Right; Future  Delano Metz, FNP-C

## 2017-01-26 NOTE — Patient Instructions (Signed)
Please go to [facility] - located [address] across the street from [place] - in the basement - Hours: 8:30-5:30 PM M-F. Do not need appointment.   You may use ibuprofen 600 mg every 6 hours with food for discomfort.   Foot Pain Many things can cause foot pain. Some common causes are:  An injury.  A sprain.  Arthritis.  Blisters.  Bunions.  Follow these instructions at home: Pay attention to any changes in your symptoms. Take these actions to help with your discomfort:  If directed, put ice on the affected area: ? Put ice in a plastic bag. ? Place a towel between your skin and the bag. ? Leave the ice on for 15-20 minutes, 3?4 times a day for 2 days.  Take over-the-counter and prescription medicines only as told by your health care provider.  Wear comfortable, supportive shoes that fit you well. Do not wear high heels.  Do not stand or walk for long periods of time.  Do not lift a lot of weight. This can put added pressure on your feet.  Do stretches to relieve foot pain and stiffness as told by your health care provider.  Rub your foot gently.  Keep your feet clean and dry.  Contact a health care provider if:  Your pain does not get better after a few days of self-care.  Your pain gets worse.  You cannot stand on your foot. Get help right away if:  Your foot is numb or tingling.  Your foot or toes are swollen.  Your foot or toes turn white or blue.  You have warmth and redness along your foot. This information is not intended to replace advice given to you by your health care provider. Make sure you discuss any questions you have with your health care provider. Document Released: 08/07/2015 Document Revised: 12/17/2015 Document Reviewed: 08/06/2014 Elsevier Interactive Patient Education  [employer].

## 2017-02-23 MED FILL — ALPRAZolam 1 MG TABS: 1 | 30 days supply | Qty: 60 | Fill #1

## 2017-03-24 ENCOUNTER — Other Ambulatory Visit: Payer: Self-pay | Admitting: Family

## 2017-03-28 MED FILL — ALPRAZolam 1 MG TABS: 1 | 30 days supply | Qty: 60 | Fill #0

## 2017-03-28 NOTE — Telephone Encounter (Signed)
Faxed script back to [facility] pharmacy..../lmb  

## 2017-04-25 MED FILL — ALPRAZolam 1 MG TABS: 1 | 30 days supply | Qty: 60 | Fill #1

## 2017-05-25 ENCOUNTER — Telehealth: Payer: Self-pay | Admitting: Family

## 2017-05-25 NOTE — Telephone Encounter (Signed)
Pt is needing a refill on ALPRAZolam (XANAX) 1 MG tablet. He is scheduled to establish care with Dr Jenny Reichmann on 06/21/17.

## 2017-05-26 MED ORDER — ALPRAZOLAM 1 MG PO TABS: 1.0000 mg | ORAL_TABLET | Freq: Two times a day (BID) | ORAL | 0 refills | Status: DC | PRN

## 2017-05-26 MED FILL — ALPRAZolam 1 MG TABS: 1 | 30 days supply | Qty: 60 | Fill #0

## 2017-05-26 NOTE — Telephone Encounter (Signed)
Faxed

## 2017-05-26 NOTE — Telephone Encounter (Signed)
Done hardcopy to [place]  

## 2017-05-26 NOTE — Telephone Encounter (Signed)
Check [place] registry last filled 03/28/2017 pls advise...James Leon

## 2017-06-21 ENCOUNTER — Other Ambulatory Visit (INDEPENDENT_AMBULATORY_CARE_PROVIDER_SITE_OTHER): Payer: 59

## 2017-06-21 ENCOUNTER — Ambulatory Visit (INDEPENDENT_AMBULATORY_CARE_PROVIDER_SITE_OTHER): Payer: 59 | Admitting: Internal Medicine

## 2017-06-21 ENCOUNTER — Encounter: Payer: Self-pay | Admitting: Internal Medicine

## 2017-06-21 VITALS — BP 118/78 | HR 81 | Temp 98.7°F | Ht 72.0 in | Wt 181.0 lb

## 2017-06-21 DIAGNOSIS — F17200 Nicotine dependence, unspecified, uncomplicated: Secondary | ICD-10-CM

## 2017-06-21 DIAGNOSIS — R21 Rash and other nonspecific skin eruption: Secondary | ICD-10-CM

## 2017-06-21 DIAGNOSIS — Z23 Encounter for immunization: Secondary | ICD-10-CM

## 2017-06-21 DIAGNOSIS — Z0000 Encounter for general adult medical examination without abnormal findings: Secondary | ICD-10-CM

## 2017-06-21 DIAGNOSIS — Z0001 Encounter for general adult medical examination with abnormal findings: Secondary | ICD-10-CM | POA: Insufficient documentation

## 2017-06-21 LAB — CBC WITH DIFFERENTIAL/PLATELET
BASOS ABS: 0.1 10*3/uL (ref 0.0–0.1)
Basophils Relative: 0.8 % (ref 0.0–3.0)
EOS ABS: 0.2 10*3/uL (ref 0.0–0.7)
Eosinophils Relative: 1.4 % (ref 0.0–5.0)
HEMATOCRIT: 49.8 % (ref 39.0–52.0)
Hemoglobin: 16.3 g/dL (ref 13.0–17.0)
LYMPHS PCT: 45.1 % (ref 12.0–46.0)
Lymphs Abs: 5.5 10*3/uL — ABNORMAL HIGH (ref 0.7–4.0)
MCHC: 32.8 g/dL (ref 30.0–36.0)
MCV: 93.5 fl (ref 78.0–100.0)
Monocytes Absolute: 1.1 10*3/uL — ABNORMAL HIGH (ref 0.1–1.0)
Monocytes Relative: 9.4 % (ref 3.0–12.0)
Neutro Abs: 5.3 10*3/uL (ref 1.4–7.7)
Neutrophils Relative %: 43.3 % (ref 43.0–77.0)
Platelets: 232 10*3/uL (ref 150.0–400.0)
RBC: 5.32 Mil/uL (ref 4.22–5.81)
RDW: 13.5 % (ref 11.5–15.5)
WBC: 12.2 10*3/uL — AB (ref 4.0–10.5)

## 2017-06-21 LAB — BASIC METABOLIC PANEL
BUN: 10 mg/dL (ref 6–23)
CALCIUM: 10.3 mg/dL (ref 8.4–10.5)
CO2: 31 mEq/L (ref 19–32)
CREATININE: 0.98 mg/dL (ref 0.40–1.50)
Chloride: 101 mEq/L (ref 96–112)
GFR: 89.47 mL/min (ref >60.00)
Glucose, Bld: 88 mg/dL (ref 70–99)
Potassium: 4.1 mEq/L (ref 3.5–5.1)
Sodium: 138 mEq/L (ref 135–145)

## 2017-06-21 LAB — LIPID PANEL
CHOL/HDL RATIO: 5
CHOLESTEROL: 236 mg/dL — AB (ref 0–200)
HDL: 52.1 mg/dL (ref >39.00)
LDL Cholesterol: 163 mg/dL — ABNORMAL HIGH (ref 0–99)
NonHDL: 184.02
TRIGLYCERIDES: 103 mg/dL (ref 0.0–149.0)
VLDL: 20.6 mg/dL (ref 0.0–40.0)

## 2017-06-21 LAB — HEPATIC FUNCTION PANEL
ALBUMIN: 5 g/dL (ref 3.5–5.2)
ALK PHOS: 53 U/L (ref 39–117)
ALT: 12 U/L (ref 0–53)
AST: 13 U/L (ref 0–37)
BILIRUBIN DIRECT: 0.1 mg/dL (ref 0.0–0.3)
TOTAL PROTEIN: 7.9 g/dL (ref 6.0–8.3)
Total Bilirubin: 0.7 mg/dL (ref 0.2–1.2)

## 2017-06-21 MED ORDER — ALPRAZOLAM 1 MG PO TABS: 1.0000 mg | ORAL_TABLET | Freq: Two times a day (BID) | ORAL | 5 refills | Status: DC | PRN

## 2017-06-21 NOTE — Assessment & Plan Note (Signed)

## 2017-06-21 NOTE — Assessment & Plan Note (Signed)
urged to quit, declines chantix for now

## 2017-06-21 NOTE — Assessment & Plan Note (Signed)
Etiology unclear, for derm referral ?

## 2017-06-21 NOTE — Patient Instructions (Addendum)
You had the flu shot today  You will be contacted regarding the referral for: dermatology  Please continue all other medications as before, and refills have been done if requested.  Please have the pharmacy call with any other refills you may need.  Please continue your efforts at being more active, low cholesterol diet, and weight control.  You are otherwise up to date with prevention measures today.  Please keep your appointments with your specialists as you may have planned  Please go to the LAB in the Basement (turn left off the elevator) for the tests to be done today  You will be contacted by phone if any changes need to be made immediately.  Otherwise, you will receive a letter about your results with an explanation, but please check with MyChart first.  Please remember to sign up for MyChart if you have not done so, as this will be important to you in the future with finding out test results, communicating by private email, and scheduling acute appointments online when needed.  Please return in 1 year for your yearly visit, or sooner if needed, with Lab testing done 3-5 days before

## 2017-06-21 NOTE — Progress Notes (Signed)
Subjective:    Patient ID: James Leon, male    DOB: 1976/07/24, 41 y.o.   MRN: [id number]  HPI  Here for wellness and f/u;  Overall doing ok;  Pt denies Chest pain, worsening SOB, DOE, wheezing, orthopnea, PND, worsening LE edema, palpitations, dizziness or syncope.  Pt denies neurological change such as new headache, facial or extremity weakness.  Pt denies polydipsia, polyuria, or low sugar symptoms. Pt states overall good compliance with treatment and medications, good tolerability, and has been trying to follow appropriate diet.  Pt denies worsening depressive symptoms, suicidal ideation or panic. No fever, night sweats, wt loss, loss of appetite, or other constitutional symptoms.  Pt states good ability with ADL's, has low fall risk, home safety reviewed and adequate, no other significant changes in hearing or vision, and only occasionally active with exercise.  Does have an erythem rash to bilat forearms for several yrs, worse in summer, better in fall.  No improvement with steroid cream, asks for derm referral Wt Readings from Last 3 Encounters:  06/21/17 181 lb (82.1 kg)  01/26/17 182 lb 6.4 oz (82.7 kg)  05/06/16 177 lb 12.8 oz (80.6 kg)  Considering quitting smoking, but just not ready yet Past Medical History:  Diagnosis Date  . Allergy   . GERD (gastroesophageal reflux disease)   . Hyperlipidemia   . Hypertension    No past surgical history on file.  reports that he has quit smoking. he has never used smokeless tobacco. He reports that he drinks alcohol. He reports that he uses drugs. Drug: Marijuana. family history includes Depression in his brother and father; Heart disease in his father; Hyperlipidemia in his father and mother; Hypertension in his mother; Prostate cancer in his paternal grandfather. No Known Allergies Current Outpatient Medications on File Prior to Visit  Medication Sig Dispense Refill  . naproxen (NAPROSYN) 500 MG tablet Take 1 tablet (500 mg total) by  mouth 2 (two) times daily with a meal. 30 tablet 0  . ranitidine (ZANTAC) 150 MG capsule Take 1 capsule (150 mg total) by mouth 2 (two) times daily. 60 capsule 5   No current facility-administered medications on file prior to visit.    Review of Systems Constitutional: Negative for other unusual diaphoresis, sweats, appetite or weight changes HENT: Negative for other worsening hearing loss, ear pain, facial swelling, mouth sores or neck stiffness.   Eyes: Negative for other worsening pain, redness or other visual disturbance.  Respiratory: Negative for other stridor or swelling Cardiovascular: Negative for other palpitations or other chest pain  Gastrointestinal: Negative for worsening diarrhea or loose stools, blood in stool, distention or other pain Genitourinary: Negative for hematuria, flank pain or other change in urine volume.  Musculoskeletal: Negative for myalgias or other joint swelling.  Skin: Negative for other color change, or other wound or worsening drainage.  Neurological: Negative for other syncope or numbness. Hematological: Negative for other adenopathy or swelling Psychiatric/Behavioral: Negative for hallucinations, other worsening agitation, SI, self-injury, or new decreased concentration All other system neg per pt    Objective:   Physical Exam BP 118/78   Pulse 81   Temp 98.7 F (37.1 C) (Oral)   Ht 6' (1.829 m)   Wt 181 lb (82.1 kg)   SpO2 100%   BMI 24.55 kg/m  VS noted,  Constitutional: Pt is oriented to person, place, and time. Appears well-developed and well-nourished, in no significant distress and comfortable Head: Normocephalic and atraumatic  Eyes: Conjunctivae and EOM are normal.  Pupils are equal, round, and reactive to light Right Ear: External ear normal without discharge Left Ear: External ear normal without discharge Nose: Nose without discharge or deformity Mouth/Throat: Oropharynx is without other ulcerations and moist  Neck: Normal range  of motion. Neck supple. No JVD present. No tracheal deviation present or significant neck LA or mass Cardiovascular: Normal rate, regular rhythm, normal heart sounds and intact distal pulses.   Pulmonary/Chest: WOB normal and breath sounds without rales or wheezing  Abdominal: Soft. Bowel sounds are normal. NT. No HSM  Musculoskeletal: Normal range of motion. Exhibits no edema Lymphadenopathy: Has no other cervical adenopathy.  Neurological: Pt is alert and oriented to person, place, and time. Pt has normal reflexes. No cranial nerve deficit. Motor grossly intact, Gait intact Skin: Skin is warm and dry. Has bilat post distal arms with erythematous nontender somewhat scaly itchy rash noted but no new ulcerations Psychiatric:  Has mild nervous mood and affect. Behavior is normal without agitation No other exam findings    Assessment & Plan:

## 2017-06-22 LAB — PSA: PSA: 0.48 ng/mL (ref 0.10–4.00)

## 2017-06-22 LAB — TSH: TSH: 1.94 u[IU]/mL (ref 0.35–4.50)

## 2017-06-22 LAB — URINALYSIS, ROUTINE W REFLEX MICROSCOPIC
Bilirubin Urine: NEGATIVE
Ketones, ur: NEGATIVE
Leukocytes, UA: NEGATIVE
NITRITE: NEGATIVE
Specific Gravity, Urine: 1.01 (ref 1.000–1.030)
Total Protein, Urine: NEGATIVE
Urine Glucose: NEGATIVE
Urobilinogen, UA: 0.2 (ref 0.0–1.0)
WBC UA: NONE SEEN (ref 0–?)
pH: 5.5 (ref 5.0–8.0)

## 2017-06-23 MED FILL — ALPRAZolam 1 MG TABS: 1 | 30 days supply | Qty: 60 | Fill #0

## 2017-07-05 ENCOUNTER — Encounter: Payer: Self-pay | Admitting: Internal Medicine

## 2017-07-21 MED FILL — ALPRAZolam 1 MG TABS: 1 | 30 days supply | Qty: 60 | Fill #1

## 2017-08-18 MED FILL — ALPRAZolam 1 MG TABS: 1 | 30 days supply | Qty: 60 | Fill #2

## 2017-09-14 MED FILL — ALPRAZolam 1 MG TABS: 1 | 30 days supply | Qty: 60 | Fill #3

## 2017-10-12 MED FILL — ALPRAZolam 1 MG TABS: 1 | 30 days supply | Qty: 60 | Fill #4

## 2017-11-09 MED FILL — ALPRAZolam 1 MG TABS: 1 | 30 days supply | Qty: 60 | Fill #5

## 2017-12-06 ENCOUNTER — Other Ambulatory Visit: Payer: Self-pay | Admitting: Internal Medicine

## 2017-12-06 NOTE — Telephone Encounter (Signed)
Done erx 

## 2017-12-06 NOTE — Telephone Encounter (Signed)
11/09/2017  60# 

## 2017-12-07 MED FILL — ALPRAZolam 1 MG TABS: 1 | 30 days supply | Qty: 60 | Fill #0

## 2018-01-04 MED FILL — ALPRAZolam 1 MG TABS: 1 | 30 days supply | Qty: 60 | Fill #1

## 2018-02-01 MED FILL — ALPRAZolam 1 MG TABS: 1 | 30 days supply | Qty: 60 | Fill #2

## 2018-02-05 ENCOUNTER — Encounter: Payer: Self-pay | Admitting: Internal Medicine

## 2018-02-05 ENCOUNTER — Ambulatory Visit (INDEPENDENT_AMBULATORY_CARE_PROVIDER_SITE_OTHER): Payer: No Typology Code available for payment source | Admitting: Internal Medicine

## 2018-02-05 VITALS — BP 124/80 | HR 94 | Temp 98.4°F | Ht 72.0 in | Wt 182.0 lb

## 2018-02-05 DIAGNOSIS — M545 Low back pain, unspecified: Secondary | ICD-10-CM

## 2018-02-05 NOTE — Patient Instructions (Signed)
/  Please continue all other medications as before, including the naproxen and muscle relaxer you have at home  Please have the pharmacy call with any other refills you may need.  Please keep your appointments with your specialists as you may have planned  Please return for any worsening pain, especially to the right leg with any weakness or numbness that wont go away

## 2018-02-05 NOTE — Progress Notes (Signed)
Subjective:    Patient ID: James Leon, male    DOB: [birth year], 42 y.o.   MRN: [id number]  HPI  Here after onset low back pain since jluy 4, initially could not get out of bed, had been doing yard work the day before but not overly strenouus and no obvious injury.  Located lower back, more midline but also radiation to the right buttocks (small amount to left buttocks) and bilat hip area. Now mild now after started severe,  Wife him come in today, pain is dull, gets stiff to sitting too long, and also right or left foot flexion both seem to make the right lower back only worse.  Doing excercises per friends and some better.  Pt bowel or bladder change, fever, wt loss,  worsening LE numbness/weakness, gait change or falls, though has some tingling to start with, now better to right post upper leg.  Did have some leftover flexeril and may have helped somewhat.   Past Medical History:  Diagnosis Date  . Allergy   . GERD (gastroesophageal reflux disease)   . Hyperlipidemia   . Hypertension    No past surgical history on file.  reports that he has quit smoking. He has never used smokeless tobacco. He reports that he drinks alcohol. He reports that he has current or past drug history. Drug: Marijuana. family history includes Depression in his brother and father; Heart disease in his father; Hyperlipidemia in his father and mother; Hypertension in his mother; Prostate cancer in his paternal grandfather. No Known Allergies Current Outpatient Medications on File Prior to Visit  Medication Sig Dispense Refill  . ALPRAZolam (XANAX) 1 MG tablet TAKE 1 TABLET BY MOUTH TWICE DAILY AS NEEDED FOR ANXIETY 60 tablet 5  . naproxen (NAPROSYN) 500 MG tablet Take 1 tablet (500 mg total) by mouth 2 (two) times daily with a meal. 30 tablet 0  . ranitidine (ZANTAC) 150 MG capsule Take 1 capsule (150 mg total) by mouth 2 (two) times daily. 60 capsule 5   No current facility-administered medications on file prior to  visit.    Review of Systems  Constitutional: Negative for other unusual diaphoresis or sweats HENT: Negative for ear discharge or swelling Eyes: Negative for other worsening visual disturbances Respiratory: Negative for stridor or other swelling  Gastrointestinal: Negative for worsening distension or other blood Genitourinary: Negative for retention or other urinary change Musculoskeletal: Negative for other MSK pain or swelling Skin: Negative for color change or other new lesions Neurological: Negative for worsening tremors and other numbness  Psychiatric/Behavioral: Negative for worsening agitation or other fatigue All other system neg per pt    Objective:   Physical Exam BP 124/80   Pulse 94   Temp 98.4 F (36.9 C) (Oral)   Ht 6' (1.829 m)   Wt 182 lb (82.6 kg)   SpO2 97%   BMI 24.68 kg/m  VS noted,  Constitutional: Pt appears in NAD HENT: Head: NCAT.  Right Ear: External ear normal.  Left Ear: External ear normal.  Eyes: . Pupils are equal, round, and reactive to light. Conjunctivae and EOM are normal Nose: without d/c or deformity Neck: Neck supple. Gross normal ROM Cardiovascular: Normal rate and regular rhythm.   Pulmonary/Chest: Effort normal and breath sounds without rales or wheezing.  Abd:  Soft, NT, ND, + BS, no organomegaly Spine:  + left lumbar paravertebral spasm tender Neurological: Pt is alert. At baseline orientation, motor grossly intact Skin: Skin is warm. No rashes, other new  lesions, no LE edema Psychiatric: Pt behavior is normal without agitation  No other exam findings    Assessment & Plan:

## 2018-02-05 NOTE — Assessment & Plan Note (Signed)
Mild now, improving, likely related to acute msk spasm and cant r/o underlying djd/ddd, for nsaid and muscle relaxer prn ,  to f/u any worsening symptoms or concerns

## 2018-03-02 ENCOUNTER — Ambulatory Visit (INDEPENDENT_AMBULATORY_CARE_PROVIDER_SITE_OTHER): Payer: No Typology Code available for payment source | Admitting: Internal Medicine

## 2018-03-02 ENCOUNTER — Encounter: Payer: Self-pay | Admitting: Internal Medicine

## 2018-03-02 VITALS — BP 110/70 | HR 86 | Temp 98.6°F | Ht 72.0 in | Wt 179.4 lb

## 2018-03-02 DIAGNOSIS — M5441 Lumbago with sciatica, right side: Secondary | ICD-10-CM | POA: Diagnosis not present

## 2018-03-02 DIAGNOSIS — M5442 Lumbago with sciatica, left side: Secondary | ICD-10-CM

## 2018-03-02 DIAGNOSIS — M545 Low back pain, unspecified: Secondary | ICD-10-CM

## 2018-03-02 MED ORDER — TRAMADOL HCL 50 MG PO TABS: 50.0000 mg | ORAL_TABLET | Freq: Three times a day (TID) | ORAL | 0 refills | Status: DC | PRN

## 2018-03-02 MED ORDER — GABAPENTIN 100 MG PO CAPS: 100.0000 mg | ORAL_CAPSULE | Freq: Three times a day (TID) | ORAL | 3 refills | Status: DC

## 2018-03-02 MED FILL — GABAPENTIN 100 MG CAP: 100 | 30 days supply | Qty: 90 | Fill #0

## 2018-03-02 MED FILL — traMADol HCL 50 MG TABS: 50 | 10 days supply | Qty: 30 | Fill #0

## 2018-03-02 MED FILL — ALPRAZolam 1 MG TABS: 1 | 30 days supply | Qty: 60 | Fill #3

## 2018-03-02 NOTE — Patient Instructions (Signed)
Please take all new medication as prescribed - the tramadol for pain, as well as the gabapentin for nerve pain  Please continue all other medications as before, and refills have been done if requested.  Please have the pharmacy call with any other refills you may need.  Please keep your appointments with your specialists as you may have planned  You will be contacted regarding the referral for: MRI for the lower back

## 2018-03-02 NOTE — Progress Notes (Signed)
   Subjective:    Patient ID: James Leon, male    DOB: 06-09-1976, 42 y.o.   MRN: [id number]  HPI  Here with recent acute onset July 4 sharp LBP as per last visit but now worsening with mod to severe intermittent radiation of the pains to the knees right > left, without bowel or bladder change, fever, wt loss,  worsening LE pain/numbness/weakness, gait change or falls.  Denies urinary symptoms such as dysuria, frequency, urgency, flank pain, hematuria or n/v, fever, chills.  Denies worsening reflux, abd pain, dysphagia, n/v, bowel change or blood.  Pain worse with more sitting, better after getting up from sitting.   Past Medical History:  Diagnosis Date  . Allergy   . GERD (gastroesophageal reflux disease)   . Hyperlipidemia   . Hypertension    History reviewed. No pertinent surgical history.  reports that he has quit smoking. He has never used smokeless tobacco. He reports that he drinks alcohol. He reports that he has current or past drug history. Drug: Marijuana. family history includes Depression in his brother and father; Heart disease in his father; Hyperlipidemia in his father and mother; Hypertension in his mother; Prostate cancer in his paternal grandfather. No Known Allergies Current Outpatient Medications on File Prior to Visit  Medication Sig Dispense Refill  . ALPRAZolam (XANAX) 1 MG tablet TAKE 1 TABLET BY MOUTH TWICE DAILY AS NEEDED FOR ANXIETY 60 tablet 5  . naproxen (NAPROSYN) 500 MG tablet Take 1 tablet (500 mg total) by mouth 2 (two) times daily with a meal. 30 tablet 0  . ranitidine (ZANTAC) 150 MG capsule Take 1 capsule (150 mg total) by mouth 2 (two) times daily. 60 capsule 5   No current facility-administered medications on file prior to visit.    Review of Systems  Constitutional: Negative for other unusual diaphoresis or sweats HENT: Negative for ear discharge or swelling Eyes: Negative for other worsening visual disturbances Respiratory: Negative for stridor or  other swelling  Gastrointestinal: Negative for worsening distension or other blood Genitourinary: Negative for retention or other urinary change Musculoskeletal: Negative for other MSK pain or swelling Skin: Negative for color change or other new lesions Neurological: Negative for worsening tremors and other numbness  Psychiatric/Behavioral: Negative for worsening agitation or other fatigue All other system neg per pt    Objective:   Physical Exam BP 110/70 (BP Location: Left Arm, Patient Position: Sitting, Cuff Size: Normal)   Pulse 86   Temp 98.6 F (37 C) (Oral)   Ht 6' (1.829 m)   Wt 179 lb 6 oz (81.4 kg)   SpO2 97%   BMI 24.33 kg/m  VS noted,  Constitutional: Pt appears in NAD HENT: Head: NCAT.  Right Ear: External ear normal.  Left Ear: External ear normal.  Eyes: . Pupils are equal, round, and reactive to light. Conjunctivae and EOM are normal Nose: without d/c or deformity Neck: Neck supple. Gross normal ROM Cardiovascular: Normal rate and regular rhythm.   Pulmonary/Chest: Effort normal and breath sounds without rales or wheezing.  Abd:  Soft, NT, ND, + BS, no organomegaly Neurological: Pt is alert. At baseline orientation, motor grossly intact Skin: Skin is warm. No rashes, other new lesions, no LE edema Psychiatric: Pt behavior is normal without agitation  No other exam findings No results found for: FOLATE     Assessment & Plan:

## 2018-03-02 NOTE — Assessment & Plan Note (Signed)
Etiology unclear but now persistent symptoms and gradually worsening > 6 wks; for tramadol prn, gabapentin trial, and MRI LS spine

## 2018-03-10 ENCOUNTER — Ambulatory Visit
Admission: RE | Admit: 2018-03-10 | Discharge: 2018-03-10 | Disposition: A | Discharge status: Home / Self Care | Payer: No Typology Code available for payment source | Source: Ambulatory Visit | Attending: Internal Medicine | Admitting: Internal Medicine

## 2018-03-10 DIAGNOSIS — M5442 Lumbago with sciatica, left side: Principal | ICD-10-CM

## 2018-03-10 DIAGNOSIS — M5441 Lumbago with sciatica, right side: Secondary | ICD-10-CM

## 2018-03-12 ENCOUNTER — Other Ambulatory Visit: Payer: Self-pay | Admitting: Internal Medicine

## 2018-03-12 ENCOUNTER — Telehealth: Payer: Self-pay

## 2018-03-12 DIAGNOSIS — M519 Unspecified thoracic, thoracolumbar and lumbosacral intervertebral disc disorder: Secondary | ICD-10-CM

## 2018-03-12 NOTE — Telephone Encounter (Signed)
Pt has been informed of results and expressed understanding.  °

## 2018-03-12 NOTE — Telephone Encounter (Signed)
-----   Message from Biagio Borg, MD sent at 03/12/2018 11:09 AM EDT ----- Letter sent, cont same tx except  The test results show that your current treatment is OK, except the MRI does show an area of disc herniation, and at least the potential for nerve pinching.  We should refer you to orthopedic for further consideration.  I will do the referral, and you should hear from the office as well.    Redmond Baseman to please inform pt, I will do referral

## 2018-03-30 MED FILL — ALPRAZolam 1 MG TABS: 1 | 30 days supply | Qty: 60 | Fill #4

## 2018-03-30 MED FILL — predniSONE 5 MG TABS: 5 | 6 days supply | Qty: 21 | Fill #0

## 2018-04-02 ENCOUNTER — Other Ambulatory Visit: Payer: Self-pay | Admitting: Internal Medicine

## 2018-04-02 MED FILL — traMADol HCL 50 MG TABS: 50 | 14 days supply | Qty: 40 | Fill #0

## 2018-04-02 NOTE — Telephone Encounter (Signed)
Done erx 

## 2018-04-02 NOTE — Telephone Encounter (Signed)
Last OV 03/02/18  [place] Controlled Substance Database checked. Last filled on 03/02/18

## 2018-04-18 MED FILL — GABAPENTIN 100 MG CAP: 100 | 30 days supply | Qty: 90 | Fill #1

## 2018-04-25 MED FILL — traMADol HCL 50 MG TABS: 50 | 14 days supply | Qty: 40 | Fill #1

## 2018-04-27 MED FILL — ALPRAZolam 1 MG TABS: 1 | 30 days supply | Qty: 60 | Fill #5

## 2018-05-23 ENCOUNTER — Other Ambulatory Visit: Payer: Self-pay | Admitting: Internal Medicine

## 2018-05-23 NOTE — Telephone Encounter (Signed)
   LOV:03/02/18 NextOV:not scheduled Last Filled/Quantity:04/27/18 60#

## 2018-05-29 MED FILL — ALPRAZolam 1 MG TABS: 1 | 30 days supply | Qty: 60 | Fill #0

## 2018-06-07 ENCOUNTER — Other Ambulatory Visit: Payer: Self-pay | Admitting: Internal Medicine

## 2018-06-07 MED FILL — traMADol HCL 50 MG TABS: 50 | 13 days supply | Qty: 40 | Fill #0

## 2018-06-07 NOTE — Telephone Encounter (Signed)
Done erx 

## 2018-06-08 MED FILL — predniSONE 5 MG TABS: 5 | 6 days supply | Qty: 21 | Fill #1

## 2018-06-12 MED FILL — GABAPENTIN 100 MG CAPS: 100 | 30 days supply | Qty: 90 | Fill #2

## 2018-06-26 MED FILL — ALPRAZolam 1 MG TABS: 1 | 30 days supply | Qty: 60 | Fill #1

## 2018-06-28 MED FILL — traMADol HCL 50 MG TABS: 50 | 13 days supply | Qty: 40 | Fill #1

## 2018-07-10 MED FILL — GABAPENTIN 100 MG CAPS: 100 | 30 days supply | Qty: 90 | Fill #3

## 2018-07-23 ENCOUNTER — Other Ambulatory Visit: Payer: Self-pay | Admitting: Internal Medicine

## 2018-07-23 NOTE — Telephone Encounter (Signed)
   LOV:03/02/18 NextOV:n/s Last Filled/Quantity:06/28/18 40#

## 2018-07-24 MED FILL — ALPRAZolam 1 MG TABS: 1 | 30 days supply | Qty: 60 | Fill #2

## 2018-08-16 ENCOUNTER — Other Ambulatory Visit: Payer: Self-pay | Admitting: Internal Medicine

## 2018-08-21 MED FILL — ALPRAZolam 1 MG TABS: 1 | 30 days supply | Qty: 60 | Fill #3

## 2018-09-18 MED FILL — ALPRAZolam 1 MG TABS: 1 | 30 days supply | Qty: 60 | Fill #4 | Status: TO

## 2018-10-15 MED FILL — ALPRAZolam 1 MG TABS: 1 | 30 days supply | Qty: 60 | Fill #0

## 2018-11-12 ENCOUNTER — Other Ambulatory Visit: Payer: Self-pay | Admitting: Internal Medicine

## 2018-11-12 NOTE — Telephone Encounter (Signed)
Last refill was 10/15/18

## 2018-11-12 NOTE — Telephone Encounter (Signed)
Done erx 

## 2018-11-19 MED FILL — ALPRAZolam 1 MG TABS: 1 | 30 days supply | Qty: 60 | Fill #0

## 2018-12-18 ENCOUNTER — Other Ambulatory Visit: Payer: Self-pay | Admitting: Internal Medicine

## 2018-12-18 MED FILL — ALPRAZolam 1 MG TABS: 1 | 30 days supply | Qty: 60 | Fill #0

## 2018-12-20 ENCOUNTER — Encounter: Payer: Self-pay | Admitting: Internal Medicine

## 2018-12-20 ENCOUNTER — Ambulatory Visit (INDEPENDENT_AMBULATORY_CARE_PROVIDER_SITE_OTHER)
Admission: RE | Admit: 2018-12-20 | Discharge: 2018-12-20 | Disposition: A | Discharge status: Home / Self Care | Payer: No Typology Code available for payment source | Source: Ambulatory Visit | Attending: Internal Medicine | Admitting: Internal Medicine

## 2018-12-20 ENCOUNTER — Other Ambulatory Visit: Payer: Self-pay

## 2018-12-20 ENCOUNTER — Other Ambulatory Visit (INDEPENDENT_AMBULATORY_CARE_PROVIDER_SITE_OTHER): Payer: No Typology Code available for payment source

## 2018-12-20 ENCOUNTER — Ambulatory Visit (INDEPENDENT_AMBULATORY_CARE_PROVIDER_SITE_OTHER): Payer: No Typology Code available for payment source | Admitting: Internal Medicine

## 2018-12-20 VITALS — BP 126/84 | HR 86 | Temp 98.6°F | Ht 72.0 in | Wt 172.0 lb

## 2018-12-20 DIAGNOSIS — M79601 Pain in right arm: Secondary | ICD-10-CM

## 2018-12-20 DIAGNOSIS — Z0000 Encounter for general adult medical examination without abnormal findings: Secondary | ICD-10-CM

## 2018-12-20 DIAGNOSIS — K219 Gastro-esophageal reflux disease without esophagitis: Secondary | ICD-10-CM | POA: Diagnosis not present

## 2018-12-20 DIAGNOSIS — Z0001 Encounter for general adult medical examination with abnormal findings: Secondary | ICD-10-CM

## 2018-12-20 DIAGNOSIS — F411 Generalized anxiety disorder: Secondary | ICD-10-CM | POA: Diagnosis not present

## 2018-12-20 DIAGNOSIS — M545 Low back pain, unspecified: Secondary | ICD-10-CM

## 2018-12-20 DIAGNOSIS — E785 Hyperlipidemia, unspecified: Secondary | ICD-10-CM

## 2018-12-20 MED ORDER — MELOXICAM 15 MG PO TABS: 15.0000 mg | ORAL_TABLET | Freq: Every day | ORAL | 3 refills | Status: DC | PRN

## 2018-12-20 MED ORDER — FAMOTIDINE 20 MG PO TABS: ORAL_TABLET | ORAL | 3 refills | Status: DC

## 2018-12-20 NOTE — Patient Instructions (Signed)
Please take all new medication as prescribed - the anti-inflammatory (mobic), and the pepcid 20 mg twice per day as needed  Please continue all other medications as before, and refills have been done if requested - the xanax recently  Please have the pharmacy call with any other refills you may need.  Please continue your efforts at being more active, low cholesterol diet, and weight control.  You are otherwise up to date with prevention measures today.  Please keep your appointments with your specialists as you may have planned  Please go to the XRAY Department in the Basement (go straight as you get off the elevator) for the x-ray testing  Please go to the LAB in the Basement (turn left off the elevator) for the tests to be done today  You will be contacted by phone if any changes need to be made immediately.  Otherwise, you will receive a letter about your results with an explanation, but please check with MyChart first.  Please remember to sign up for MyChart if you have not done so, as this will be important to you in the future with finding out test results, communicating by private email, and scheduling acute appointments online when needed.  Please return in 1 year for your yearly visit, or sooner if needed, with Lab testing done 3-5 days before

## 2018-12-20 NOTE — Progress Notes (Signed)
Subjective:    Patient ID: James Leon, male    DOB: Dec 02, 1975, 43 y.o.   MRN: [id number]  HPI  Here for wellness and f/u;  Overall doing ok;  Pt denies Chest pain, worsening SOB, DOE, wheezing, orthopnea, PND, worsening LE edema, palpitations, dizziness or syncope.  Pt denies neurological change such as new headache, facial or extremity weakness.  Pt denies polydipsia, polyuria, or low sugar symptoms. Pt states overall good compliance with treatment and medications, good tolerability, and has been trying to follow appropriate diet.  Pt denies worsening depressive symptoms, suicidal ideation or panic. No fever, night sweats, wt loss, loss of appetite, or other constitutional symptoms.  Pt states good ability with ADL's, has low fall risk, home safety reviewed and adequate, no other significant changes in hearing or vision, and only occasionally active with exercise. Wt Readings from Last 3 Encounters:  12/20/18 172 lb (78 kg)  03/02/18 179 lb 6 oz (81.4 kg)  02/05/18 182 lb (82.6 kg)   BP Readings from Last 3 Encounters:  12/20/18 126/84  03/02/18 110/70  02/05/18 124/80  3 wks ago borrowed the neighbors ladder and at one point it slammed down on the post distal arm/wrist with severe pain to start, got better and toughed it out, but unfortunately worse again in the last 2-3 days.  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 Pt continues to have recurring LBP without change in severity, bowel or bladder change, fever, wt loss,  worsening LE pain/numbness/weakness, gait change or falls. Also c/o persistent mild reflux for 3 mo despite diet change and trying low fat diet. Denies worsening abd pain, dysphagia, n/v, bowel change or blood. Past Medical History:  Diagnosis Date  . Allergy   . GERD (gastroesophageal reflux disease)   . Hyperlipidemia   . Hypertension    No past surgical history on file.  reports that he has quit smoking. He has never used smokeless tobacco. He  reports current alcohol use. He reports current drug use. Drug: Marijuana. family history includes Depression in his brother and father; Heart disease in his father; Hyperlipidemia in his father and mother; Hypertension in his mother; Prostate cancer in his paternal grandfather. No Known Allergies Current Outpatient Medications on File Prior to Visit  Medication Sig Dispense Refill  . ALPRAZolam (XANAX) 1 MG tablet Take 1 tablet (1 mg total) by mouth 2 (two) times daily as needed for anxiety. 60 tablet 2  . gabapentin (NEURONTIN) 100 MG capsule Take 1 capsule (100 mg total) by mouth 3 (three) times daily. 90 capsule 3  . naproxen (NAPROSYN) 500 MG tablet Take 1 tablet (500 mg total) by mouth 2 (two) times daily with a meal. 30 tablet 0  . ranitidine (ZANTAC) 150 MG capsule Take 1 capsule (150 mg total) by mouth 2 (two) times daily. 60 capsule 5  . traMADol (ULTRAM) 50 MG tablet TAKE 1 TABLET BY MOUTH EVERY 8 HOURS AS NEEDED. 40 tablet 1   No current facility-administered medications on file prior to visit.    Review of Systems Constitutional: Negative for other unusual diaphoresis, sweats, appetite or weight changes HENT: Negative for other worsening hearing loss, ear pain, facial swelling, mouth sores or neck stiffness.   Eyes: Negative for other worsening pain, redness or other visual disturbance.  Respiratory: Negative for other stridor or swelling Cardiovascular: Negative for other palpitations or other chest pain  Gastrointestinal: Negative for worsening diarrhea or loose stools, blood in stool, distention or other pain  Genitourinary: Negative for hematuria, flank pain or other change in urine volume.  Musculoskeletal: Negative for myalgias or other joint swelling.  Skin: Negative for other color change, or other wound or worsening drainage.  Neurological: Negative for other syncope or numbness. Hematological: Negative for other adenopathy or swelling Psychiatric/Behavioral: Negative  for hallucinations, other worsening agitation, SI, self-injury, or new decreased concentration All other system neg per pt    Objective:   Physical Exam BP 126/84   Pulse 86   Temp 98.6 F (37 C) (Oral)   Ht 6' (1.829 m)   Wt 172 lb (78 kg)   SpO2 96%   BMI 23.33 kg/m  VS noted,  Constitutional: Pt is oriented to person, place, and time. Appears well-developed and well-nourished, in no significant distress and comfortable Head: Normocephalic and atraumatic  Eyes: Conjunctivae and EOM are normal. Pupils are equal, round, and reactive to light Right Ear: External ear normal without discharge Left Ear: External ear normal without discharge Nose: Nose without discharge or deformity Mouth/Throat: Oropharynx is without other ulcerations and moist  Neck: Normal range of motion. Neck supple. No JVD present. No tracheal deviation present or significant neck LA or mass Cardiovascular: Normal rate, regular rhythm, normal heart sounds and intact distal pulses.   Pulmonary/Chest: WOB normal and breath sounds without rales or wheezing  Abdominal: Soft. Bowel sounds are normal. NT. No HSM  Musculoskeletal: Normal range of motion. Exhibits no edema except for distal RUE non discrete area over 6 cm prox to the wrist Lymphadenopathy: Has no other cervical adenopathy.  Neurological: Pt is alert and oriented to person, place, and time. Pt has normal reflexes. No cranial nerve deficit. Motor grossly intact, Gait intact Skin: Skin is warm and dry. No rash noted or new ulcerations Psychiatric:  Has normal mood and affect. Behavior is normal without agitation No other exam findings Lab Results  Component Value Date   WBC 12.2 (H) 06/21/2017   HGB 16.3 06/21/2017   HCT 49.8 06/21/2017   PLT 232.0 06/21/2017   GLUCOSE 88 12/20/2018   CHOL 231 (H) 12/20/2018   TRIG 94.0 12/20/2018   HDL 58.20 12/20/2018   LDLDIRECT 126.6 07/05/2010   LDLCALC 154 (H) 12/20/2018   ALT 12 12/20/2018   AST 16  12/20/2018   NA 138 12/20/2018   K 4.1 12/20/2018   CL 102 12/20/2018   CREATININE 0.94 12/20/2018   BUN 14 12/20/2018   CO2 25 12/20/2018   TSH 1.37 12/20/2018   PSA 0.99 12/20/2018       Assessment & Plan:

## 2018-12-21 ENCOUNTER — Other Ambulatory Visit: Payer: Self-pay | Admitting: Internal Medicine

## 2018-12-21 ENCOUNTER — Telehealth: Payer: Self-pay

## 2018-12-21 DIAGNOSIS — R3129 Other microscopic hematuria: Secondary | ICD-10-CM

## 2018-12-21 LAB — HEPATIC FUNCTION PANEL
ALT: 12 U/L (ref 0–53)
AST: 16 U/L (ref 0–37)
Albumin: 4.9 g/dL (ref 3.5–5.2)
Alkaline Phosphatase: 52 U/L (ref 39–117)
Bilirubin, Direct: 0.1 mg/dL (ref 0.0–0.3)
Total Bilirubin: 0.6 mg/dL (ref 0.2–1.2)
Total Protein: 7.8 g/dL (ref 6.0–8.3)

## 2018-12-21 LAB — LIPID PANEL
Cholesterol: 231 mg/dL — ABNORMAL HIGH (ref 0–200)
HDL: 58.2 mg/dL (ref >39.00)
LDL Cholesterol: 154 mg/dL — ABNORMAL HIGH (ref 0–99)
NonHDL: 172.42
Total CHOL/HDL Ratio: 4
Triglycerides: 94 mg/dL (ref 0.0–149.0)
VLDL: 18.8 mg/dL (ref 0.0–40.0)

## 2018-12-21 LAB — BASIC METABOLIC PANEL
BUN: 14 mg/dL (ref 6–23)
CO2: 25 mEq/L (ref 19–32)
Calcium: 9.9 mg/dL (ref 8.4–10.5)
Chloride: 102 mEq/L (ref 96–112)
Creatinine, Ser: 0.94 mg/dL (ref 0.40–1.50)
GFR: 87.68 mL/min (ref >60.00)
Glucose, Bld: 88 mg/dL (ref 70–99)
Potassium: 4.1 mEq/L (ref 3.5–5.1)
Sodium: 138 mEq/L (ref 135–145)

## 2018-12-21 LAB — TSH: TSH: 1.37 u[IU]/mL (ref 0.35–4.50)

## 2018-12-21 LAB — URINALYSIS, ROUTINE W REFLEX MICROSCOPIC
Bilirubin Urine: NEGATIVE
Ketones, ur: NEGATIVE
Leukocytes,Ua: NEGATIVE
Nitrite: NEGATIVE
Specific Gravity, Urine: 1.015 (ref 1.000–1.030)
Total Protein, Urine: NEGATIVE
Urine Glucose: NEGATIVE
Urobilinogen, UA: 0.2 (ref 0.0–1.0)
pH: 6 (ref 5.0–8.0)

## 2018-12-21 LAB — PSA: PSA: 0.99 ng/mL (ref 0.10–4.00)

## 2018-12-21 NOTE — Telephone Encounter (Signed)
Pt has been informed of results and expressed understanding.  °

## 2018-12-21 NOTE — Telephone Encounter (Signed)
-----   Message from Biagio Borg, MD sent at 12/21/2018  2:30 PM EDT ----- Left message on MyChart, pt to cont same tx except  The test results show that your current treatment is OK, as the tests were OK except the urine testing showed a small amount of blood.  The reason for this is not clear, so we normally refer to Urology for a complete evaluation to make sure there is no infection or cancer or other issue.      James Leon to please inform pt, I will do referral

## 2018-12-22 ENCOUNTER — Encounter: Payer: Self-pay | Admitting: Internal Medicine

## 2018-12-22 DIAGNOSIS — E785 Hyperlipidemia, unspecified: Secondary | ICD-10-CM | POA: Insufficient documentation

## 2018-12-22 DIAGNOSIS — M79601 Pain in right arm: Secondary | ICD-10-CM | POA: Insufficient documentation

## 2018-12-22 NOTE — Assessment & Plan Note (Signed)
stable overall by history and exam, recent data reviewed with pt, and pt to continue medical treatment as before,  to f/u any worsening symptoms or concerns  

## 2018-12-22 NOTE — Assessment & Plan Note (Signed)
Severe, possibly genetic based, for lower chol diet, declines statin

## 2018-12-22 NOTE — Assessment & Plan Note (Signed)
Mild persistent, for antireflux diet, add pepcid 20 bid

## 2018-12-22 NOTE — Assessment & Plan Note (Signed)
Stable, improved with PT, to f/u any worsening symptoms or concerns

## 2018-12-22 NOTE — Assessment & Plan Note (Signed)

## 2018-12-22 NOTE — Assessment & Plan Note (Addendum)
S/p ladder trauma x 3 wks, improved but now worsening pain and swelling, suspect overuse recent at work too soon after initial injury, for nsaid prn, xray r/o fx today  In addition to the time spent performing CPE, I spent an additional 25 minutes face to face,in which greater than 50% of this time was spent in counseling and coordination of care for patient's acute illness as documented, including the differential dx, treatment, further evaluation and other management of right arm pain, low back pain, GERD, HLD, anxiety

## 2019-01-09 MED FILL — MELOXICAM 15 MG TABLET: 15 | 90 days supply | Qty: 90 | Fill #0

## 2019-01-09 MED FILL — SM ACID REDUCER 20 MG TAB: 20 | 25 days supply | Qty: 50 | Fill #0

## 2019-01-15 MED FILL — ALPRAZolam 1 MG TABS: 1 | 30 days supply | Qty: 60 | Fill #0

## 2019-02-08 DIAGNOSIS — B360 Pityriasis versicolor: Secondary | ICD-10-CM | POA: Diagnosis not present

## 2019-02-08 DIAGNOSIS — D225 Melanocytic nevi of trunk: Secondary | ICD-10-CM | POA: Diagnosis not present

## 2019-02-08 DIAGNOSIS — L821 Other seborrheic keratosis: Secondary | ICD-10-CM | POA: Diagnosis not present

## 2019-02-08 DIAGNOSIS — L918 Other hypertrophic disorders of the skin: Secondary | ICD-10-CM | POA: Diagnosis not present

## 2019-02-08 DIAGNOSIS — L720 Epidermal cyst: Secondary | ICD-10-CM | POA: Diagnosis not present

## 2019-02-08 MED FILL — FLUCONAZOLE 150 MG TABS: 150 | 7 days supply | Qty: 4 | Fill #0

## 2019-02-08 MED FILL — KETOCONAZOLE 2 % SHAM: 2 | 30 days supply | Qty: 120 | Fill #0

## 2019-02-12 MED FILL — ALPRAZolam 1 MG TABS: 1 | 30 days supply | Qty: 60 | Fill #1

## 2019-02-27 DIAGNOSIS — R311 Benign essential microscopic hematuria: Secondary | ICD-10-CM | POA: Diagnosis not present

## 2019-03-08 DIAGNOSIS — R311 Benign essential microscopic hematuria: Secondary | ICD-10-CM | POA: Diagnosis not present

## 2019-03-08 DIAGNOSIS — R3129 Other microscopic hematuria: Secondary | ICD-10-CM | POA: Diagnosis not present

## 2019-03-11 ENCOUNTER — Other Ambulatory Visit: Payer: Self-pay | Admitting: Internal Medicine

## 2019-03-11 NOTE — Telephone Encounter (Signed)
Done erx 

## 2019-03-13 DIAGNOSIS — R311 Benign essential microscopic hematuria: Secondary | ICD-10-CM | POA: Diagnosis not present

## 2019-03-13 MED FILL — ALPRAZolam 1 MG TABS: 1 | 30 days supply | Qty: 60 | Fill #0

## 2019-05-22 ENCOUNTER — Other Ambulatory Visit: Payer: Self-pay | Admitting: *Deleted

## 2019-05-22 DIAGNOSIS — Z20822 Contact with and (suspected) exposure to covid-19: Secondary | ICD-10-CM

## 2019-05-22 DIAGNOSIS — Z20828 Contact with and (suspected) exposure to other viral communicable diseases: Secondary | ICD-10-CM | POA: Diagnosis not present

## 2019-05-23 LAB — NOVEL CORONAVIRUS, NAA: SARS-CoV-2, NAA: NOT DETECTED

## 2019-06-05 ENCOUNTER — Other Ambulatory Visit: Payer: Self-pay | Admitting: Internal Medicine

## 2019-06-06 NOTE — Telephone Encounter (Signed)
Done erx 

## 2019-06-10 ENCOUNTER — Other Ambulatory Visit: Payer: Self-pay

## 2019-06-10 DIAGNOSIS — Z20822 Contact with and (suspected) exposure to covid-19: Secondary | ICD-10-CM

## 2019-06-12 LAB — NOVEL CORONAVIRUS, NAA: SARS-CoV-2, NAA: NOT DETECTED

## 2019-08-16 ENCOUNTER — Ambulatory Visit: Payer: BC Managed Care – PPO | Attending: Internal Medicine

## 2019-08-16 DIAGNOSIS — Z20822 Contact with and (suspected) exposure to covid-19: Secondary | ICD-10-CM

## 2019-08-18 LAB — NOVEL CORONAVIRUS, NAA: SARS-CoV-2, NAA: NOT DETECTED

## 2019-08-23 ENCOUNTER — Other Ambulatory Visit: Payer: Self-pay | Admitting: Internal Medicine

## 2019-08-23 NOTE — Telephone Encounter (Signed)
Done erx 

## 2019-08-30 ENCOUNTER — Ambulatory Visit: Payer: BC Managed Care – PPO | Attending: Internal Medicine

## 2019-08-30 DIAGNOSIS — Z20822 Contact with and (suspected) exposure to covid-19: Secondary | ICD-10-CM

## 2019-08-31 LAB — NOVEL CORONAVIRUS, NAA: SARS-CoV-2, NAA: NOT DETECTED

## 2019-10-17 ENCOUNTER — Other Ambulatory Visit: Payer: Self-pay

## 2019-10-17 ENCOUNTER — Ambulatory Visit: Payer: BC Managed Care – PPO | Attending: Internal Medicine

## 2019-10-17 DIAGNOSIS — Z23 Encounter for immunization: Secondary | ICD-10-CM

## 2019-10-17 NOTE — Progress Notes (Signed)
   Covid-19 Vaccination [facility]  Name:  Burlon Sama    MRN: [id number] DOB: 14-Apr-1976  10/17/2019  Mr. Haberberger was observed post Covid-19 immunization for 15 minutes without incident. He was provided with Vaccine Information Sheet and instruction to access the V-Safe system.   Mr. Schomberg was instructed to call 911 with any severe reactions post vaccine: Marland Kitchen Difficulty breathing  . Swelling of face and throat  . A fast heartbeat  . A bad rash all over body  . Dizziness and weakness   Immunizations Administered    Name Date Dose VIS Date Route   Pfizer COVID-19 Vaccine 10/17/2019  9:40 AM 0.3 mL 07/05/2019 Intramuscular   Manufacturer: [employer], [employer]   Lot: [id number]   [place]: [id number]

## 2019-10-19 ENCOUNTER — Other Ambulatory Visit: Payer: Self-pay | Admitting: Internal Medicine

## 2019-10-19 NOTE — Telephone Encounter (Signed)
Done erx 

## 2019-11-13 ENCOUNTER — Ambulatory Visit: Payer: BC Managed Care – PPO | Attending: Internal Medicine

## 2019-11-13 DIAGNOSIS — Z23 Encounter for immunization: Secondary | ICD-10-CM

## 2019-11-13 NOTE — Progress Notes (Signed)
   Covid-19 Vaccination Clinic  Name:  James Leon    MRN: [id number] DOB: [birth year]  11/13/2019  [name] was observed post Covid-19 immunization for 15 minutes without incident. He was provided with Vaccine Information Sheet and instruction to access the V-Safe system.   [name] was instructed to call 911 with any severe reactions post vaccine: Marland Kitchen Difficulty breathing  . Swelling of face and throat  . A fast heartbeat  . A bad rash all over body  . Dizziness and weakness   Immunizations Administered    Name Date Dose VIS Date Route   Pfizer COVID-19 Vaccine 11/13/2019  9:27 AM 0.3 mL 09/18/2018 Intramuscular   Manufacturer: [place]   Lot: [id number]   Alfred: [id number]

## 2019-12-10 ENCOUNTER — Other Ambulatory Visit: Payer: Self-pay | Admitting: Internal Medicine

## 2019-12-10 NOTE — Telephone Encounter (Signed)
Done erx  Ok to let pt know needs ROV for further refills 

## 2019-12-24 ENCOUNTER — Encounter: Payer: No Typology Code available for payment source | Admitting: Internal Medicine

## 2019-12-25 IMAGING — DX RIGHT WRIST - COMPLETE 3+ VIEW
4 series · 4 of 4 positions shown · non-contrast
Comparison: None.

CLINICAL DATA: Pain status post trauma 3 days ago.

EXAM:
RIGHT WRIST - COMPLETE 3+ VIEW

[wrist pa]
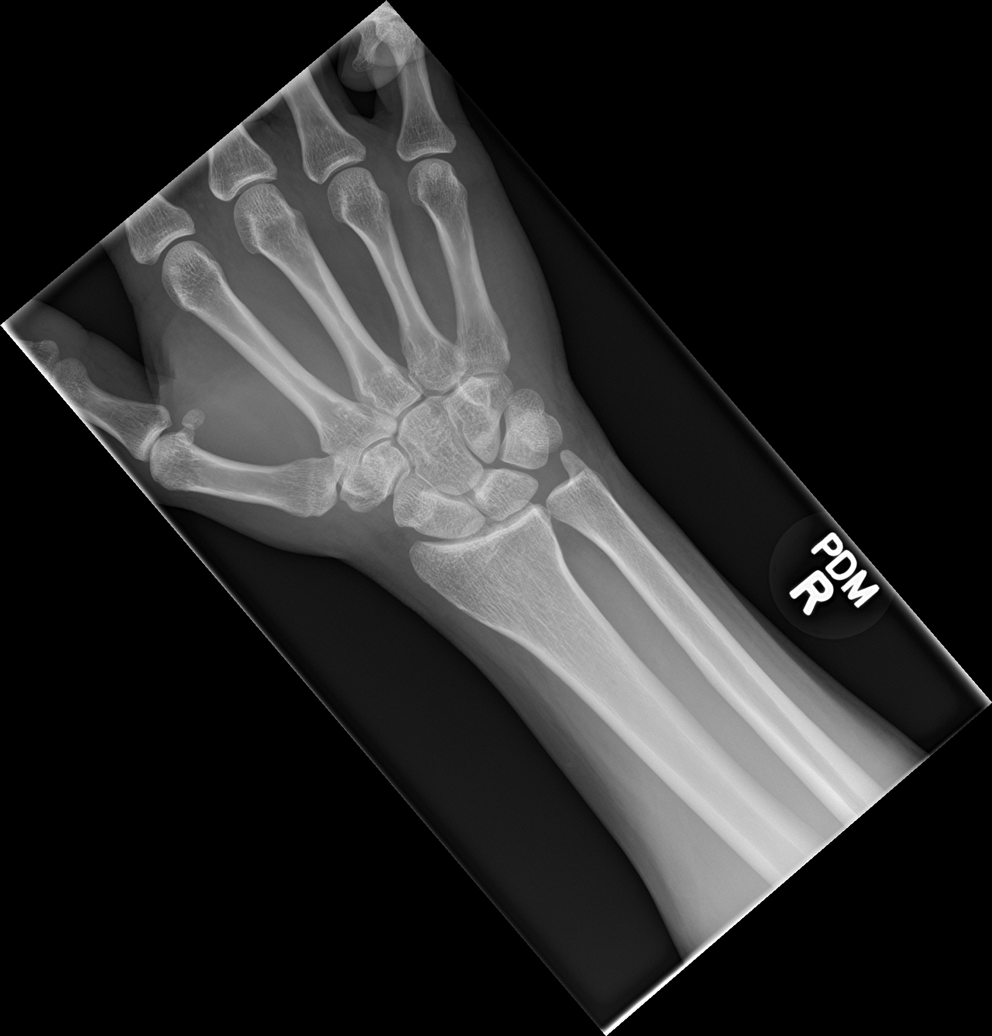

[wrist obl]
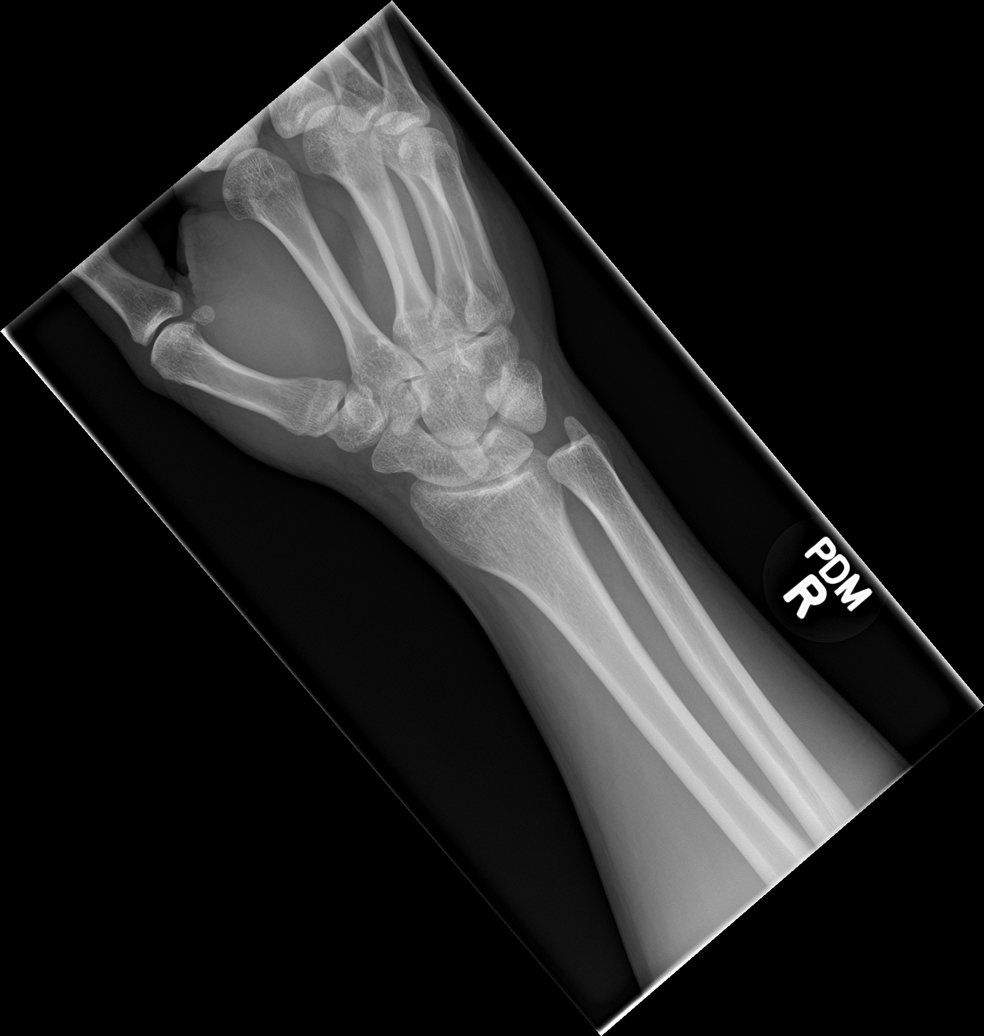

[wrist lat]
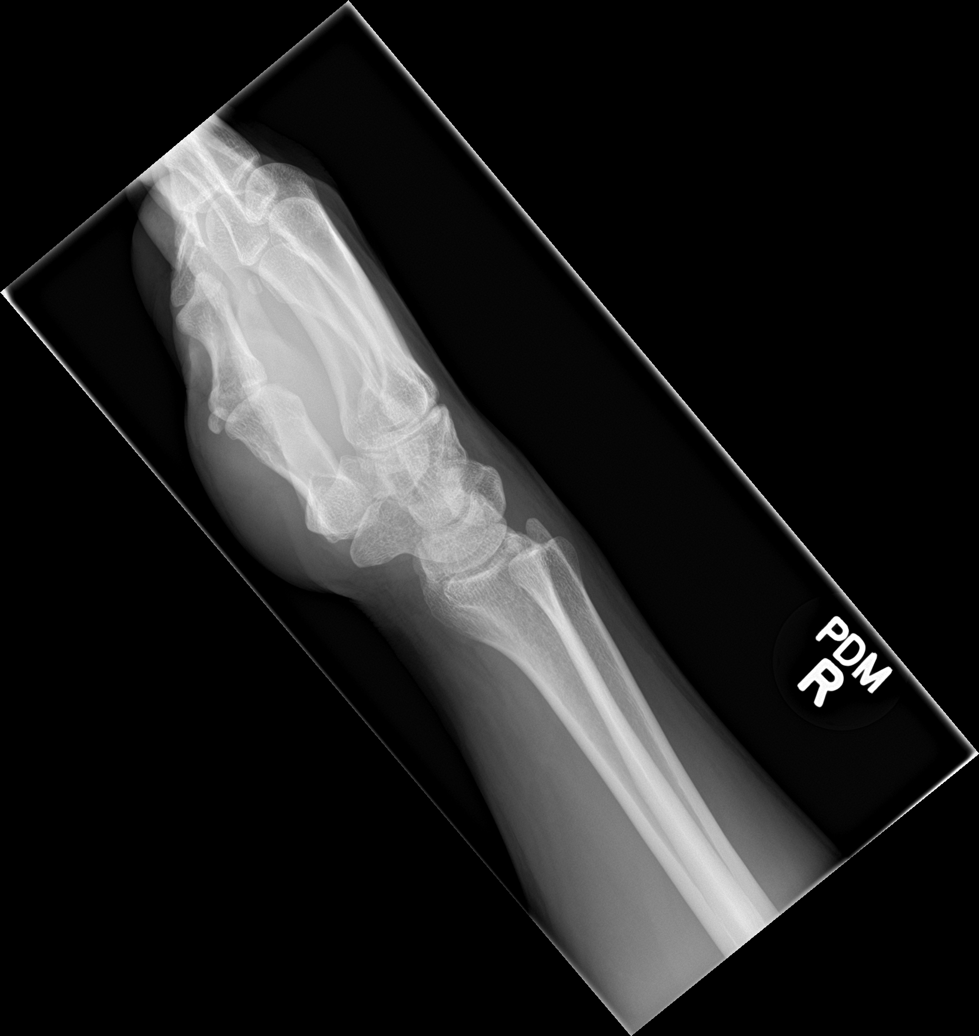

[navicular]
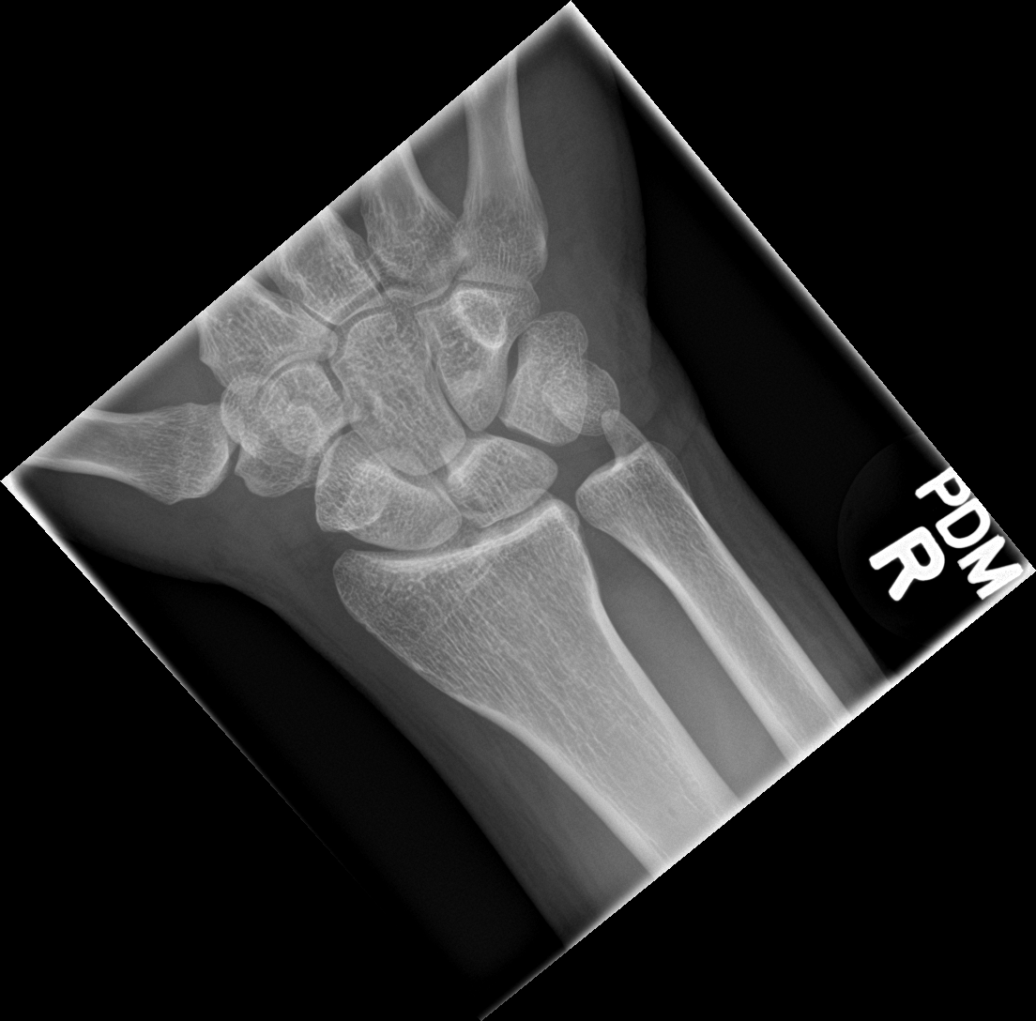

[4 of 4 positions shown; findings below may reference images not displayed]

FINDINGS: There is no evidence of fracture or dislocation. There is no
evidence of arthropathy or other focal bone abnormality. There is
mild soft tissue swelling surrounding the wrist.
IMPRESSION: Negative.

## 2019-12-31 ENCOUNTER — Encounter: Payer: No Typology Code available for payment source | Admitting: Internal Medicine

## 2019-12-31 DIAGNOSIS — Z0289 Encounter for other administrative examinations: Secondary | ICD-10-CM

## 2020-01-03 ENCOUNTER — Other Ambulatory Visit: Payer: Self-pay | Admitting: Internal Medicine

## 2020-01-04 NOTE — Telephone Encounter (Signed)
Done erx  Please let pt know - due for ROV for further refills

## 2020-02-09 ENCOUNTER — Other Ambulatory Visit: Payer: Self-pay | Admitting: Internal Medicine

## 2020-02-10 NOTE — Telephone Encounter (Signed)
Done erx  Ok to let pt know- no further refills without ROV due to office refill policy

## 2020-03-08 ENCOUNTER — Other Ambulatory Visit: Payer: Self-pay | Admitting: Internal Medicine

## 2020-03-09 NOTE — Telephone Encounter (Signed)
Sent to Dr. Kristeen Mans.

## 2020-03-09 NOTE — Telephone Encounter (Signed)
Unable to refill xanax further without OV

## 2020-03-13 ENCOUNTER — Ambulatory Visit (INDEPENDENT_AMBULATORY_CARE_PROVIDER_SITE_OTHER): Payer: BC Managed Care – PPO | Admitting: Internal Medicine

## 2020-03-13 ENCOUNTER — Other Ambulatory Visit: Payer: Self-pay

## 2020-03-13 ENCOUNTER — Encounter: Payer: Self-pay | Admitting: Internal Medicine

## 2020-03-13 VITALS — BP 120/80 | HR 61 | Temp 98.9°F | Ht 72.0 in | Wt 177.0 lb

## 2020-03-13 DIAGNOSIS — M545 Low back pain: Secondary | ICD-10-CM

## 2020-03-13 DIAGNOSIS — E559 Vitamin D deficiency, unspecified: Secondary | ICD-10-CM

## 2020-03-13 DIAGNOSIS — E538 Deficiency of other specified B group vitamins: Secondary | ICD-10-CM

## 2020-03-13 DIAGNOSIS — G8929 Other chronic pain: Secondary | ICD-10-CM

## 2020-03-13 DIAGNOSIS — F17200 Nicotine dependence, unspecified, uncomplicated: Secondary | ICD-10-CM | POA: Diagnosis not present

## 2020-03-13 DIAGNOSIS — Z23 Encounter for immunization: Secondary | ICD-10-CM | POA: Diagnosis not present

## 2020-03-13 DIAGNOSIS — Z1159 Encounter for screening for other viral diseases: Secondary | ICD-10-CM

## 2020-03-13 DIAGNOSIS — Z0000 Encounter for general adult medical examination without abnormal findings: Secondary | ICD-10-CM

## 2020-03-13 DIAGNOSIS — F411 Generalized anxiety disorder: Secondary | ICD-10-CM

## 2020-03-13 MED ORDER — ALPRAZOLAM 1 MG PO TABS: ORAL_TABLET | ORAL | 5 refills | Status: DC

## 2020-03-13 MED ORDER — GABAPENTIN 100 MG PO CAPS: 100.0000 mg | ORAL_CAPSULE | Freq: Three times a day (TID) | ORAL | 5 refills | Status: DC

## 2020-03-13 MED ORDER — TRAMADOL HCL 50 MG PO TABS: 50.0000 mg | ORAL_TABLET | Freq: Three times a day (TID) | ORAL | 2 refills | Status: DC | PRN

## 2020-03-13 MED ORDER — FAMOTIDINE 20 MG PO TABS: ORAL_TABLET | ORAL | 3 refills | Status: AC

## 2020-03-13 NOTE — Patient Instructions (Signed)
Please continue all other medications as before, and refills have been done if requested.  Please have the pharmacy call with any other refills you may need.  Please continue your efforts at being more active, low cholesterol diet, and weight control.  You are otherwise up to date with prevention measures today.  Please keep your appointments with your specialists as you may have planned  Please stop smoking  Please go to the LAB at the blood drawing area for the tests to be done  You will be contacted by phone if any changes need to be made immediately.  Otherwise, you will receive a letter about your results with an explanation, but please check with MyChart first.  Please remember to sign up for MyChart if you have not done so, as this will be important to you in the future with finding out test results, communicating by private email, and scheduling acute appointments online when needed.  Please make an Appointment to return for your 1 year visit, or sooner if needed

## 2020-03-13 NOTE — Progress Notes (Signed)
Subjective:    Patient ID: James Leon, male    DOB: 10-10-1975, 44 y.o.   MRN: [id number]  HPI  Here for wellness and f/u;  Overall doing ok;  Pt denies Chest pain, worsening SOB, DOE, wheezing, orthopnea, PND, worsening LE edema, palpitations, dizziness or syncope.  Pt denies neurological change such as new headache, facial or extremity weakness.  Pt denies polydipsia, polyuria, or low sugar symptoms. Pt states overall good compliance with treatment and medications, good tolerability, and has been trying to follow appropriate diet.  Pt denies worsening depressive symptoms, suicidal ideation or panic. No fever, night sweats, wt loss, loss of appetite, or other constitutional symptoms.  Pt states good ability with ADL's, has low fall risk, home safety reviewed and adequate, no other significant changes in hearing or vision, and only occasionally active with exercise. Denies urinary symptoms such as dysuria, frequency, urgency, flank pain, hematuria or n/v, fever, chills.  Pt continues to have recurring LBP without change in severity, bowel or bladder change, fever, wt loss,  worsening LE pain/numbness/weakness, gait change or falls. Still smoking, not ready to quit though has cut back Past Medical History:  Diagnosis Date  . Allergy   . GERD (gastroesophageal reflux disease)   . Hyperlipidemia   . Hypertension    History reviewed. No pertinent surgical history.  reports that he has quit smoking. He has never used smokeless tobacco. He reports current alcohol use. He reports current drug use. Drug: Marijuana. family history includes Depression in his brother and father; Heart disease in his father; Hyperlipidemia in his father and mother; Hypertension in his mother; Prostate cancer in his paternal grandfather. No Known Allergies No current outpatient medications on file prior to visit.   No current facility-administered medications on file prior to visit.   Review of Systems All otherwise neg  per pt     Objective:   Physical Exam BP 120/80 (BP Location: Left Arm, Patient Position: Sitting, Cuff Size: Large)   Pulse 61   Temp 98.9 F (37.2 C) (Oral)   Ht 6' (1.829 m)   Wt 177 lb (80.3 kg)   SpO2 97%   BMI 24.01 kg/m  VS noted,  Constitutional: Pt appears in NAD HENT: Head: NCAT.  Right Ear: External ear normal.  Left Ear: External ear normal.  Eyes: . Pupils are equal, round, and reactive to light. Conjunctivae and EOM are normal Nose: without d/c or deformity Neck: Neck supple. Gross normal ROM Cardiovascular: Normal rate and regular rhythm.   Pulmonary/Chest: Effort normal and breath sounds without rales or wheezing.  Abd:  Soft, NT, ND, + BS, no organomegaly Neurological: Pt is alert. At baseline orientation, motor grossly intact Skin: Skin is warm. No rashes, other new lesions, no LE edema Psychiatric: Pt behavior is normal without agitation  All otherwise neg per pt Lab Results  Component Value Date   WBC 8.6 03/13/2020   HGB 15.7 03/13/2020   HCT 46.7 03/13/2020   PLT 255 03/13/2020   GLUCOSE 88 12/20/2018   CHOL 227 (H) 03/13/2020   TRIG 164 (H) 03/13/2020   HDL 55 03/13/2020   LDLDIRECT 126.6 07/05/2010   LDLCALC 142 (H) 03/13/2020   ALT 12 12/20/2018   AST 16 12/20/2018   NA 138 12/20/2018   K 4.1 12/20/2018   CL 102 12/20/2018   CREATININE 0.94 12/20/2018   BUN 14 12/20/2018   CO2 25 12/20/2018   TSH 2.07 03/13/2020   PSA 0.5 03/13/2020  Assessment & Plan:

## 2020-03-14 ENCOUNTER — Other Ambulatory Visit: Payer: Self-pay | Admitting: Internal Medicine

## 2020-03-14 ENCOUNTER — Encounter: Payer: Self-pay | Admitting: Internal Medicine

## 2020-03-14 LAB — URINALYSIS, ROUTINE W REFLEX MICROSCOPIC
Bilirubin Urine: NEGATIVE
Glucose, UA: NEGATIVE
Hgb urine dipstick: NEGATIVE
Ketones, ur: NEGATIVE
Leukocytes,Ua: NEGATIVE
Nitrite: NEGATIVE
Protein, ur: NEGATIVE
Specific Gravity, Urine: 1.006 (ref 1.001–1.03)
pH: 7 (ref 5.0–8.0)

## 2020-03-14 LAB — CBC WITH DIFFERENTIAL/PLATELET
Absolute Monocytes: 1041 cells/uL — ABNORMAL HIGH (ref 200–950)
Basophils Absolute: 120 cells/uL (ref 0–200)
Basophils Relative: 1.4 %
Eosinophils Absolute: 181 cells/uL (ref 15–500)
Eosinophils Relative: 2.1 %
HCT: 46.7 % (ref 38.5–50.0)
Hemoglobin: 15.7 g/dL (ref 13.2–17.1)
Lymphs Abs: 4059 cells/uL — ABNORMAL HIGH (ref 850–3900)
MCH: 30.3 pg (ref 27.0–33.0)
MCHC: 33.6 g/dL (ref 32.0–36.0)
MCV: 90.2 fL (ref 80.0–100.0)
MPV: 10.9 fL (ref 7.5–12.5)
Monocytes Relative: 12.1 %
Neutro Abs: 3199 cells/uL (ref 1500–7800)
Neutrophils Relative %: 37.2 %
Platelets: 255 10*3/uL (ref 140–400)
RBC: 5.18 10*6/uL (ref 4.20–5.80)
RDW: 12.6 % (ref 11.0–15.0)
Total Lymphocyte: 47.2 %
WBC: 8.6 10*3/uL (ref 3.8–10.8)

## 2020-03-14 LAB — VITAMIN D 25 HYDROXY (VIT D DEFICIENCY, FRACTURES): Vit D, 25-Hydroxy: 24 ng/mL — ABNORMAL LOW (ref 30–100)

## 2020-03-14 LAB — LIPID PANEL
Cholesterol: 227 mg/dL — ABNORMAL HIGH (ref <200)
HDL: 55 mg/dL (ref >=40)
LDL Cholesterol (Calc): 142 mg/dL (calc) — ABNORMAL HIGH
Non-HDL Cholesterol (Calc): 172 mg/dL (calc) — ABNORMAL HIGH (ref <130)
Total CHOL/HDL Ratio: 4.1 (calc) (ref <5.0)
Triglycerides: 164 mg/dL — ABNORMAL HIGH (ref <150)

## 2020-03-14 LAB — PSA: PSA: 0.5 ng/mL (ref <=4.0)

## 2020-03-14 LAB — VITAMIN B12: Vitamin B-12: 375 pg/mL (ref 200–1100)

## 2020-03-14 LAB — TSH: TSH: 2.07 mIU/L (ref 0.40–4.50)

## 2020-03-14 MED ORDER — VITAMIN D (ERGOCALCIFEROL) 1.25 MG (50000 UNIT) PO CAPS: 50000.0000 [IU] | ORAL_CAPSULE | ORAL | 0 refills | Status: DC

## 2020-03-14 NOTE — Assessment & Plan Note (Signed)

## 2020-03-14 NOTE — Assessment & Plan Note (Signed)
stable overall by history and exam, recent data reviewed with pt, and pt to continue medical treatment as before,  to f/u any worsening symptoms or concerns, for tramadol prn

## 2020-03-14 NOTE — Assessment & Plan Note (Signed)
Counseled to quit 

## 2020-03-14 NOTE — Assessment & Plan Note (Signed)
stable overall by history and exam, recent data reviewed with pt, and pt to continue medical treatment as before,  to f/u any worsening symptoms or concerns  

## 2020-03-16 ENCOUNTER — Encounter: Payer: Self-pay | Admitting: Internal Medicine

## 2020-03-16 LAB — COMPLETE METABOLIC PANEL WITH GFR
AG Ratio: 1.9 (calc) (ref 1.0–2.5)
ALT: 11 U/L (ref 9–46)
AST: 13 U/L (ref 10–40)
Albumin: 4.7 g/dL (ref 3.6–5.1)
Alkaline phosphatase (APISO): 52 U/L (ref 36–130)
BUN: 10 mg/dL (ref 7–25)
CO2: 27 mmol/L (ref 20–32)
Calcium: 9.4 mg/dL (ref 8.6–10.3)
Chloride: 101 mmol/L (ref 98–110)
Creat: 1.12 mg/dL (ref 0.60–1.35)
GFR, Est African American: 92 mL/min/{1.73_m2} (ref >=60)
GFR, Est Non African American: 79 mL/min/{1.73_m2} (ref >=60)
Globulin: 2.5 g/dL (calc) (ref 1.9–3.7)
Glucose, Bld: 94 mg/dL (ref 65–99)
Potassium: 4.4 mmol/L (ref 3.5–5.3)
Sodium: 137 mmol/L (ref 135–146)
Total Bilirubin: 0.5 mg/dL (ref 0.2–1.2)
Total Protein: 7.2 g/dL (ref 6.1–8.1)

## 2020-03-16 LAB — HEPATITIS C ANTIBODY
Hepatitis C Ab: NONREACTIVE
SIGNAL TO CUT-OFF: 0.01 (ref <1.00)

## 2020-03-16 MED FILL — VIT D2 1.25 MG (50,000 UNIT: 1.25 MG | 84 days supply | Qty: 12 | Fill #0

## 2020-03-25 DIAGNOSIS — B360 Pityriasis versicolor: Secondary | ICD-10-CM | POA: Diagnosis not present

## 2020-03-25 DIAGNOSIS — L918 Other hypertrophic disorders of the skin: Secondary | ICD-10-CM | POA: Diagnosis not present

## 2020-03-25 DIAGNOSIS — L720 Epidermal cyst: Secondary | ICD-10-CM | POA: Diagnosis not present

## 2020-03-31 ENCOUNTER — Other Ambulatory Visit: Payer: Self-pay

## 2020-03-31 MED ORDER — VITAMIN D (ERGOCALCIFEROL) 1.25 MG (50000 UNIT) PO CAPS
50000.0000 [IU] | ORAL_CAPSULE | ORAL | 0 refills | Status: DC
Start: 2020-03-31 — End: 2021-12-16

## 2020-04-22 DIAGNOSIS — D485 Neoplasm of uncertain behavior of skin: Secondary | ICD-10-CM | POA: Diagnosis not present

## 2020-05-12 ENCOUNTER — Other Ambulatory Visit: Payer: Self-pay | Admitting: Internal Medicine

## 2020-05-12 NOTE — Telephone Encounter (Signed)
Done erx 

## 2020-07-02 ENCOUNTER — Ambulatory Visit: Payer: BC Managed Care – PPO | Attending: Internal Medicine

## 2020-07-02 DIAGNOSIS — Z23 Encounter for immunization: Secondary | ICD-10-CM

## 2020-07-02 NOTE — Progress Notes (Signed)
   Covid-19 Vaccination Clinic  Name:  James Leon    MRN: [id number] DOB: [birth year]  07/02/2020  [name] was observed post Covid-19 immunization for 15 minutes without incident. He was provided with Vaccine Information Sheet and instruction to access the V-Safe system.   [name] was instructed to call 911 with any severe reactions post vaccine: Marland Kitchen Difficulty breathing  . Swelling of face and throat  . A fast heartbeat  . A bad rash all over body  . Dizziness and weakness   Immunizations Administered    Name Date Dose VIS Date Route   Pfizer COVID-19 Vaccine 07/02/2020  5:42 PM 0.3 mL 05/13/2020 Intramuscular   Manufacturer: [place]   Lot: [id number]   NDC: [address]1

## 2020-07-20 ENCOUNTER — Other Ambulatory Visit: Payer: Self-pay | Admitting: Internal Medicine

## 2020-08-12 ENCOUNTER — Encounter: Payer: Self-pay | Admitting: Internal Medicine

## 2020-08-12 ENCOUNTER — Other Ambulatory Visit: Payer: Self-pay | Admitting: Internal Medicine

## 2020-08-12 DIAGNOSIS — E559 Vitamin D deficiency, unspecified: Secondary | ICD-10-CM

## 2020-10-02 ENCOUNTER — Other Ambulatory Visit: Payer: Self-pay | Admitting: Internal Medicine

## 2020-10-18 ENCOUNTER — Other Ambulatory Visit: Payer: Self-pay | Admitting: Internal Medicine

## 2020-11-30 ENCOUNTER — Other Ambulatory Visit: Payer: Self-pay | Admitting: Internal Medicine

## 2021-01-15 ENCOUNTER — Other Ambulatory Visit: Payer: Self-pay | Admitting: Internal Medicine

## 2021-01-30 ENCOUNTER — Other Ambulatory Visit: Payer: Self-pay | Admitting: Internal Medicine

## 2021-02-25 ENCOUNTER — Encounter: Payer: Self-pay | Admitting: Internal Medicine

## 2021-03-05 ENCOUNTER — Ambulatory Visit: Payer: Self-pay | Admitting: Internal Medicine

## 2021-03-19 ENCOUNTER — Ambulatory Visit (INDEPENDENT_AMBULATORY_CARE_PROVIDER_SITE_OTHER): Payer: Managed Care, Other (non HMO) | Admitting: Internal Medicine

## 2021-03-19 ENCOUNTER — Other Ambulatory Visit: Payer: Self-pay

## 2021-03-19 ENCOUNTER — Encounter: Payer: Self-pay | Admitting: Internal Medicine

## 2021-03-19 ENCOUNTER — Ambulatory Visit (INDEPENDENT_AMBULATORY_CARE_PROVIDER_SITE_OTHER): Payer: Managed Care, Other (non HMO)

## 2021-03-19 VITALS — BP 110/68 | HR 61 | Temp 98.2°F | Ht 69.0 in | Wt 149.0 lb

## 2021-03-19 DIAGNOSIS — Z1211 Encounter for screening for malignant neoplasm of colon: Secondary | ICD-10-CM

## 2021-03-19 DIAGNOSIS — Z0001 Encounter for general adult medical examination with abnormal findings: Secondary | ICD-10-CM | POA: Diagnosis not present

## 2021-03-19 DIAGNOSIS — R42 Dizziness and giddiness: Secondary | ICD-10-CM | POA: Diagnosis not present

## 2021-03-19 DIAGNOSIS — R634 Abnormal weight loss: Secondary | ICD-10-CM

## 2021-03-19 DIAGNOSIS — E559 Vitamin D deficiency, unspecified: Secondary | ICD-10-CM

## 2021-03-19 DIAGNOSIS — J309 Allergic rhinitis, unspecified: Secondary | ICD-10-CM

## 2021-03-19 DIAGNOSIS — F17200 Nicotine dependence, unspecified, uncomplicated: Secondary | ICD-10-CM

## 2021-03-19 DIAGNOSIS — Z23 Encounter for immunization: Secondary | ICD-10-CM | POA: Diagnosis not present

## 2021-03-19 DIAGNOSIS — E538 Deficiency of other specified B group vitamins: Secondary | ICD-10-CM | POA: Diagnosis not present

## 2021-03-19 DIAGNOSIS — E785 Hyperlipidemia, unspecified: Secondary | ICD-10-CM

## 2021-03-19 MED ORDER — MECLIZINE HCL 12.5 MG PO TABS: 12.5000 mg | ORAL_TABLET | Freq: Three times a day (TID) | ORAL | 1 refills | Status: DC | PRN

## 2021-03-19 MED ORDER — CETIRIZINE HCL 10 MG PO TABS
10.0000 mg | ORAL_TABLET | Freq: Every day | ORAL | 11 refills | Status: DC
Start: 2021-03-19 — End: 2022-04-05

## 2021-03-19 MED ORDER — CHOLECALCIFEROL 50 MCG (2000 UT) PO TABS: ORAL_TABLET | ORAL | 99 refills | Status: AC

## 2021-03-19 MED ORDER — PREDNISONE 10 MG PO TABS: ORAL_TABLET | ORAL | 0 refills | Status: DC

## 2021-03-19 NOTE — Patient Instructions (Addendum)
You had the Tdap tetanus shot today  You will be contacted regarding the referral for: colonoscopy  Please take all new medication as prescribed - the zyrtec, and prednisone, and meclizine as needed for vertigo  Please take OTC Vitamin D3 at 2000 units per day, indefinitely,   Please continue all other medications as before, and refills have been done if requested.  Please have the pharmacy call with any other refills you may need.  Please continue your efforts at being more active, low cholesterol diet, and weight control.  You are otherwise up to date with prevention measures today.  Please keep your appointments with your specialists as you may have planned  You will be contacted regarding the referral for: colonoscopy  Please go to the XRAY Department in the first floor for the x-ray testing  Please go to the LAB at the blood drawing area for the tests to be done  You will be contacted by phone if any changes need to be made immediately.  Otherwise, you will receive a letter about your results with an explanation, but please check with MyChart first.  Please remember to sign up for MyChart if you have not done so, as this will be important to you in the future with finding out test results, communicating by private email, and scheduling acute appointments online when needed.  Please make an Appointment to return for your 1 year visit, or sooner if needed, with Lab testing by Appointment as well, to be done about 3-5 days before at the [facility] (so this is for TWO appointments - please see the scheduling desk as you leave)  Due to the ongoing Covid 19 pandemic, our lab now requires an appointment for any labs done at our office.  If you need labs done and do not have an appointment, please call our office ahead of time to schedule before presenting to the lab for your testing.

## 2021-03-19 NOTE — Progress Notes (Signed)
Patient ID: Jaquarion Spofford, male   DOB: 12-Apr-1976, 45 y.o.   MRN: [id number]         Chief Complaint:: wellness exam and Annual Exam (Weight loss) and Dizziness         HPI:  Jerrald Ting is a 45 y.o. male here for wellness exam; due for colnoscopy, declines flu shot and pneuomvax; o/w up to date with preventive referalls and immunizations                        Also here with wife concerned about his recent wt loss he attributes to better diet.  Does have several wks ongoing nasal allergy symptoms with clearish congestion, itch and sneezing, without fever, pain, ST, cough, swelling or wheezing.  Did also have vertigo mod to severe symptomatic onset this am with position change assoc with sweats.  Not taking Vit D.  Pt denies chest pain, increased sob or doe, wheezing, orthopnea, PND, increased LE swelling, palpitations, or syncope.    Pt denies polydipsia, polyuria, or new focal neuro s/s.  No other new complaints   Wt here down to 149 from 172 x 2 yrs ago   Wt Readings from Last 3 Encounters:  03/19/21 149 lb (67.6 kg)  03/13/20 177 lb (80.3 kg)  12/20/18 172 lb (78 kg)   BP Readings from Last 3 Encounters:  03/19/21 110/68  03/13/20 120/80  12/20/18 126/84   Immunization History  Administered Date(s) Administered   Influenza Whole 07/05/2010   Influenza,inj,Quad PF,6+ Mos 04/27/2015, 05/06/2016, 06/21/2017, 03/13/2020   PFIZER(Purple Top)SARS-COV-2 Vaccination 10/17/2019, 11/13/2019, 07/02/2020   Td 07/05/2010   Tdap 03/19/2021   Health Maintenance Due  Topic Date Due   COLONOSCOPY (Pts 45-24yr Insurance coverage will need to be confirmed)  Never done      Past Medical History:  Diagnosis Date   Allergy    GERD (gastroesophageal reflux disease)    Hyperlipidemia    Hypertension    History reviewed. No pertinent surgical history.  reports that he has been smoking cigarettes. He has been smoking an average of .25 packs per day. He has never used smokeless tobacco. He reports  current alcohol use. He reports current drug use. Drug: Marijuana. family history includes Depression in his brother and father; Heart disease in his father; Hyperlipidemia in his father and mother; Hypertension in his mother; Prostate cancer in his paternal grandfather. No Known Allergies Current Outpatient Medications on File Prior to Visit  Medication Sig Dispense Refill   ALPRAZolam (XANAX) 1 MG tablet TAKE ONE TABLET BY MOUTH TWICE A DAY AS NEEDED FOR ANXIETY 60 tablet 2   famotidine (PEPCID) 20 MG tablet 1 tab by mouth twice per day as needed 180 tablet 3   gabapentin (NEURONTIN) 100 MG capsule TAKE ONE CAPSULE BY MOUTH THREE TIMES A DAY 90 capsule 5   Multiple Vitamin (MULTIVITAMIN) tablet Take 1 tablet by mouth daily.     traMADol (ULTRAM) 50 MG tablet TAKE ONE TABLET BY MOUTH EVERY 8 HOURS AS NEEDED FOR MODERATE PAIN 60 tablet 2   Vitamin D, Ergocalciferol, (DRISDOL) 1.25 MG (50000 UNIT) CAPS capsule Take 1 capsule (50,000 Units total) by mouth every 7 (seven) days. 12 capsule 0   No current facility-administered medications on file prior to visit.        ROS:  All others reviewed and negative.  Objective        PE:  BP 110/68 (BP Location: Left Arm, Patient Position: Sitting,  Cuff Size: Large)   Pulse 61   Temp 98.2 F (36.8 C) (Oral)   Ht '5\' 9"'$  (1.753 m)   Wt 149 lb (67.6 kg)   SpO2 96%   BMI 22.00 kg/m                 Constitutional: Pt appears in NAD               HENT: Head: NCAT.                Right Ear: External ear normal.                 Left Ear: External ear normal.                Eyes: . Pupils are equal, round, and reactive to light. Conjunctivae and EOM are normal               Nose: without d/c or deformity               Neck: Neck supple. Gross normal ROM               Cardiovascular: Normal rate and regular rhythm.                 Pulmonary/Chest: Effort normal and breath sounds without rales or wheezing.                Abd:  Soft, NT, ND, + BS, no  organomegaly               Neurological: Pt is alert. At baseline orientation, motor grossly intact               Skin: Skin is warm. No rashes, no other new lesions, LE edema - none               Psychiatric: Pt behavior is normal without agitation   Micro: none  Cardiac tracings I have personally interpreted today:  none  Pertinent Radiological findings (summarize): none   Lab Results  Component Value Date   WBC 11.5 (H) 03/19/2021   HGB 16.2 03/19/2021   HCT 47.2 03/19/2021   PLT 269 03/19/2021   GLUCOSE 108 (H) 03/19/2021   CHOL 257 (H) 03/19/2021   TRIG 98 03/19/2021   HDL 67 03/19/2021   LDLDIRECT 126.6 07/05/2010   LDLCALC 168 (H) 03/19/2021   ALT 17 03/19/2021   AST 15 03/19/2021   NA 141 03/19/2021   K 4.5 03/19/2021   CL 101 03/19/2021   CREATININE 0.94 03/19/2021   BUN 11 03/19/2021   CO2 31 03/19/2021   TSH 0.91 03/19/2021   PSA 1.03 03/19/2021   Assessment/Plan:  Som Huda is a 45 y.o. Asian [4] White or Caucasian [1] male with  has a past medical history of Allergy, GERD (gastroesophageal reflux disease), Hyperlipidemia, and Hypertension.  Encounter for well adult exam with abnormal findings Age and sex appropriate education and counseling updated with regular exercise and diet Referrals for preventative services - for colonoscopy Immunizations addressed - declines flu shot and pneumovax Smoking counseling  - counsled to quit pt not ready Evidence for depression or other mood disorder - none significant Most recent labs reviewed. I have personally reviewed and have noted: 1) the patient's medical and social history 2) The patient's current medications and supplements 3) The patient's height, weight, and BMI have been recorded in the chart   TOBACCO USE Pt counsled to quit, pt not  ready  HLD (hyperlipidemia) Lab Results  Component Value Date   LDLCALC 168 (H) 03/19/2021   uncontrolled, pt to continue current lower chol diet for now pending  other issues today, declines statin   Weight loss Etiology unclear, I suspect possibly physical rather than diet or psychiatric, for cxr, and consider CT imaging if not helpful  Vertigo Rather dramatic onset this am I suspect incidental, peripheral related to ear involvement with allergies; for meclizine prn, consider MRI brain if not improved  Allergic rhinitis Mild to mod, for predpac asd, continue flonase, add zyrtec prn, to f/u any worsening symptoms or concerns  Vitamin D deficiency Last vitamin D Lab Results  Component Value Date   VD25OH 30 03/19/2021   Low, to start oral replacement  Followup: Return in about 1 year (around 03/19/2022).  Cathlean Cower, MD 03/21/2021 8:18 PM [facility]

## 2021-03-20 ENCOUNTER — Encounter: Payer: Self-pay | Admitting: Internal Medicine

## 2021-03-20 ENCOUNTER — Other Ambulatory Visit: Payer: Self-pay | Admitting: Internal Medicine

## 2021-03-20 DIAGNOSIS — R3129 Other microscopic hematuria: Secondary | ICD-10-CM

## 2021-03-20 DIAGNOSIS — R634 Abnormal weight loss: Secondary | ICD-10-CM

## 2021-03-20 LAB — LIPID PANEL
Cholesterol: 257 mg/dL — ABNORMAL HIGH (ref <200)
HDL: 67 mg/dL (ref >=40)
LDL Cholesterol (Calc): 168 mg/dL (calc) — ABNORMAL HIGH
Non-HDL Cholesterol (Calc): 190 mg/dL (calc) — ABNORMAL HIGH (ref <130)
Total CHOL/HDL Ratio: 3.8 (calc) (ref <5.0)
Triglycerides: 98 mg/dL (ref <150)

## 2021-03-20 LAB — BASIC METABOLIC PANEL
BUN: 11 mg/dL (ref 7–25)
CO2: 31 mmol/L (ref 20–32)
Calcium: 10.3 mg/dL (ref 8.6–10.3)
Chloride: 101 mmol/L (ref 98–110)
Creat: 0.94 mg/dL (ref 0.60–1.29)
Glucose, Bld: 108 mg/dL — ABNORMAL HIGH (ref 65–99)
Potassium: 4.5 mmol/L (ref 3.5–5.3)
Sodium: 141 mmol/L (ref 135–146)

## 2021-03-20 LAB — CBC WITH DIFFERENTIAL/PLATELET
Absolute Monocytes: 679 cells/uL (ref 200–950)
Basophils Absolute: 104 cells/uL (ref 0–200)
Basophils Relative: 0.9 %
Eosinophils Absolute: 81 cells/uL (ref 15–500)
Eosinophils Relative: 0.7 %
HCT: 47.2 % (ref 38.5–50.0)
Hemoglobin: 16.2 g/dL (ref 13.2–17.1)
Lymphs Abs: 3692 cells/uL (ref 850–3900)
MCH: 30.8 pg (ref 27.0–33.0)
MCHC: 34.3 g/dL (ref 32.0–36.0)
MCV: 89.7 fL (ref 80.0–100.0)
MPV: 11 fL (ref 7.5–12.5)
Monocytes Relative: 5.9 %
Neutro Abs: 6946 cells/uL (ref 1500–7800)
Neutrophils Relative %: 60.4 %
Platelets: 269 10*3/uL (ref 140–400)
RBC: 5.26 10*6/uL (ref 4.20–5.80)
RDW: 12.3 % (ref 11.0–15.0)
Total Lymphocyte: 32.1 %
WBC: 11.5 10*3/uL — ABNORMAL HIGH (ref 3.8–10.8)

## 2021-03-20 LAB — URINALYSIS, ROUTINE W REFLEX MICROSCOPIC
Bacteria, UA: NONE SEEN /HPF
Bilirubin Urine: NEGATIVE
Glucose, UA: NEGATIVE
Hyaline Cast: NONE SEEN /LPF
Ketones, ur: NEGATIVE
Nitrite: NEGATIVE
Protein, ur: NEGATIVE
Specific Gravity, Urine: 1.01 (ref 1.001–1.035)
Squamous Epithelial / HPF: NONE SEEN /HPF (ref <=5)
pH: 8 (ref 5.0–8.0)

## 2021-03-20 LAB — HEPATIC FUNCTION PANEL
AG Ratio: 1.7 (calc) (ref 1.0–2.5)
ALT: 17 U/L (ref 9–46)
AST: 15 U/L (ref 10–40)
Albumin: 4.8 g/dL (ref 3.6–5.1)
Alkaline phosphatase (APISO): 54 U/L (ref 36–130)
Bilirubin, Direct: 0.1 mg/dL (ref 0.0–0.2)
Globulin: 2.8 g/dL (calc) (ref 1.9–3.7)
Indirect Bilirubin: 0.5 mg/dL (calc) (ref 0.2–1.2)
Total Bilirubin: 0.6 mg/dL (ref 0.2–1.2)
Total Protein: 7.6 g/dL (ref 6.1–8.1)

## 2021-03-20 LAB — TSH: TSH: 0.91 mIU/L (ref 0.40–4.50)

## 2021-03-20 LAB — VITAMIN D 25 HYDROXY (VIT D DEFICIENCY, FRACTURES): Vit D, 25-Hydroxy: 30 ng/mL (ref 30–100)

## 2021-03-20 LAB — PSA: PSA: 1.03 ng/mL (ref <=4.00)

## 2021-03-20 LAB — MICROSCOPIC MESSAGE

## 2021-03-20 LAB — VITAMIN B12: Vitamin B-12: 402 pg/mL (ref 200–1100)

## 2021-03-21 ENCOUNTER — Encounter: Payer: Self-pay | Admitting: Internal Medicine

## 2021-03-21 DIAGNOSIS — R42 Dizziness and giddiness: Secondary | ICD-10-CM | POA: Insufficient documentation

## 2021-03-21 DIAGNOSIS — J309 Allergic rhinitis, unspecified: Secondary | ICD-10-CM | POA: Insufficient documentation

## 2021-03-21 DIAGNOSIS — R634 Abnormal weight loss: Secondary | ICD-10-CM | POA: Insufficient documentation

## 2021-03-21 DIAGNOSIS — E559 Vitamin D deficiency, unspecified: Secondary | ICD-10-CM | POA: Insufficient documentation

## 2021-03-21 NOTE — Assessment & Plan Note (Signed)
Pt counsled to quit, pt not ready °

## 2021-03-21 NOTE — Assessment & Plan Note (Signed)
Etiology unclear, I suspect possibly physical rather than diet or psychiatric, for cxr, and consider CT imaging if not helpful

## 2021-03-21 NOTE — Assessment & Plan Note (Signed)
Last vitamin D Lab Results  Component Value Date   VD25OH 30 03/19/2021   Low, to start oral replacement

## 2021-03-21 NOTE — Assessment & Plan Note (Signed)
Age and sex appropriate education and counseling updated with regular exercise and diet Referrals for preventative services - for colonoscopy Immunizations addressed - declines flu shot and pneumovax Smoking counseling  - counsled to quit pt not ready Evidence for depression or other mood disorder - none significant Most recent labs reviewed. I have personally reviewed and have noted: 1) the patient's medical and social history 2) The patient's current medications and supplements 3) The patient's height, weight, and BMI have been recorded in the chart

## 2021-03-21 NOTE — Assessment & Plan Note (Signed)
Mild to mod, for predpac asd, continue flonase, add zyrtec prn, to f/u any worsening symptoms or concerns

## 2021-03-21 NOTE — Assessment & Plan Note (Signed)
Rather dramatic onset this am I suspect incidental, peripheral related to ear involvement with allergies; for meclizine prn, consider MRI brain if not improved

## 2021-03-21 NOTE — Assessment & Plan Note (Signed)
Lab Results  Component Value Date   LDLCALC 168 (H) 03/19/2021   uncontrolled, pt to continue current lower chol diet for now pending other issues today, declines statin

## 2021-03-31 ENCOUNTER — Telehealth: Payer: Self-pay

## 2021-03-31 DIAGNOSIS — R319 Hematuria, unspecified: Secondary | ICD-10-CM

## 2021-03-31 NOTE — Telephone Encounter (Signed)
James Leon with Radiology calling to confirm if small amount of blood was present in patient's urine. After reading results, radiology would like CT order changed to W contrast instead of W WO contrast as this is the hematurial protocol. Please advise.

## 2021-03-31 NOTE — Telephone Encounter (Signed)
Ok this is done 

## 2021-04-03 ENCOUNTER — Encounter: Payer: Self-pay | Admitting: Internal Medicine

## 2021-04-09 ENCOUNTER — Other Ambulatory Visit: Payer: Self-pay | Admitting: Internal Medicine

## 2021-04-12 ENCOUNTER — Ambulatory Visit
Admission: RE | Admit: 2021-04-12 | Discharge: 2021-04-12 | Disposition: A | Discharge status: Home / Self Care | Payer: Managed Care, Other (non HMO) | Source: Ambulatory Visit | Attending: Internal Medicine | Admitting: Internal Medicine

## 2021-04-12 ENCOUNTER — Encounter: Payer: Self-pay | Admitting: Internal Medicine

## 2021-04-12 DIAGNOSIS — R319 Hematuria, unspecified: Secondary | ICD-10-CM

## 2021-04-29 ENCOUNTER — Other Ambulatory Visit: Payer: Self-pay | Admitting: Internal Medicine

## 2021-06-07 ENCOUNTER — Other Ambulatory Visit: Payer: Self-pay | Admitting: Internal Medicine

## 2021-06-28 ENCOUNTER — Other Ambulatory Visit: Payer: Self-pay | Admitting: Internal Medicine

## 2021-06-29 ENCOUNTER — Encounter: Payer: Self-pay | Admitting: Gastroenterology

## 2021-08-04 ENCOUNTER — Other Ambulatory Visit: Payer: Self-pay | Admitting: Internal Medicine

## 2021-09-03 ENCOUNTER — Ambulatory Visit (AMBULATORY_SURGERY_CENTER): Payer: Managed Care, Other (non HMO) | Admitting: *Deleted

## 2021-09-03 ENCOUNTER — Other Ambulatory Visit: Payer: Self-pay

## 2021-09-03 VITALS — Ht 69.0 in | Wt 159.5 lb

## 2021-09-03 DIAGNOSIS — Z1211 Encounter for screening for malignant neoplasm of colon: Secondary | ICD-10-CM

## 2021-09-03 MED ORDER — NA SULFATE-K SULFATE-MG SULF 17.5-3.13-1.6 GM/177ML PO SOLN: 1.0000 | Freq: Once | ORAL | 0 refills | Status: AC

## 2021-09-03 NOTE — Progress Notes (Signed)

## 2021-09-08 ENCOUNTER — Other Ambulatory Visit: Payer: Self-pay | Admitting: Internal Medicine

## 2021-09-15 ENCOUNTER — Encounter: Payer: Self-pay | Admitting: Gastroenterology

## 2021-09-17 ENCOUNTER — Ambulatory Visit (AMBULATORY_SURGERY_CENTER): Payer: Managed Care, Other (non HMO) | Admitting: Gastroenterology

## 2021-09-17 ENCOUNTER — Encounter: Payer: Self-pay | Admitting: Gastroenterology

## 2021-09-17 ENCOUNTER — Other Ambulatory Visit: Payer: Self-pay

## 2021-09-17 VITALS — BP 145/95 | HR 63 | Temp 98.7°F | Resp 15 | Ht 69.0 in | Wt 159.5 lb

## 2021-09-17 DIAGNOSIS — D125 Benign neoplasm of sigmoid colon: Secondary | ICD-10-CM

## 2021-09-17 DIAGNOSIS — D122 Benign neoplasm of ascending colon: Secondary | ICD-10-CM

## 2021-09-17 DIAGNOSIS — Z1211 Encounter for screening for malignant neoplasm of colon: Secondary | ICD-10-CM | POA: Diagnosis present

## 2021-09-17 MED ORDER — SODIUM CHLORIDE 0.9 % IV SOLN: 500.0000 mL | INTRAVENOUS | Status: DC

## 2021-09-17 NOTE — Progress Notes (Signed)
[facility] History and Physical   Primary Care Physician:  Biagio Borg, MD   Reason for Procedure:   Colon cancer screening  Plan:    Screening colonoscopy     HPI: James Leon is a 46 y.o. male undergoing initial average risk screening colonoscopy.  He has no family history of colon cancer and no chronic GI symptoms.    Past Medical History:  Diagnosis Date   Allergy    Anxiety    GERD (gastroesophageal reflux disease)    Hyperlipidemia    Hypertension     Past Surgical History:  Procedure Laterality Date   LUMP REMOVED FROM CHEEK Right    1 1/2 YEAR AGO AS OF 09/03/21    Prior to Admission medications   Medication Sig Start Date End Date Taking? Authorizing Provider  ALPRAZolam Duanne Moron) 1 MG tablet TAKE ONE TABLET BY MOUTH TWICE A DAY AS NEEDED FOR ANXIETY 06/28/21  Yes Biagio Borg, MD  cetirizine (ZYRTEC) 10 MG tablet Take 1 tablet (10 mg total) by mouth daily. 03/19/21 03/19/22 Yes Biagio Borg, MD  Cholecalciferol 50 MCG (2000 UT) TABS 1 tab by mouth once daily 03/19/21  Yes Biagio Borg, MD  gabapentin (NEURONTIN) 100 MG capsule TAKE ONE CAPSULE BY MOUTH THREE TIMES A DAY Patient taking differently: daily. 04/29/21  Yes Biagio Borg, MD  Multiple Vitamin (MULTIVITAMIN) tablet Take 1 tablet by mouth daily.   Yes [provider]  OVER THE COUNTER MEDICATION as needed. TIGER BOMB TOPICAL   Yes [provider]  traMADol (ULTRAM) 50 MG tablet TAKE ONE TABLET BY MOUTH EVERY 8 HOURS AS NEEDED FOR MODERATE PAIN 09/08/21  Yes Biagio Borg, MD  famotidine (PEPCID) 20 MG tablet 1 tab by mouth twice per day as needed 03/13/20   Biagio Borg, MD  meclizine (ANTIVERT) 12.5 MG tablet Take 1 tablet (12.5 mg total) by mouth 3 (three) times daily as needed for dizziness. Patient not taking: Reported on 09/03/2021 03/19/21 03/19/22  Biagio Borg, MD  predniSONE (DELTASONE) 10 MG tablet 3 tabs by mouth per day for 3 days,2tabs per day for 3 days,1tab per  day for 3 days Patient not taking: Reported on 09/03/2021 03/19/21   Biagio Borg, MD  Vitamin D, Ergocalciferol, (DRISDOL) 1.25 MG (50000 UNIT) CAPS capsule Take 1 capsule (50,000 Units total) by mouth every 7 (seven) days. Patient not taking: Reported on 09/03/2021 03/31/20   Biagio Borg, MD    Current Outpatient Medications  Medication Sig Dispense Refill   ALPRAZolam (XANAX) 1 MG tablet TAKE ONE TABLET BY MOUTH TWICE A DAY AS NEEDED FOR ANXIETY 60 tablet 2   cetirizine (ZYRTEC) 10 MG tablet Take 1 tablet (10 mg total) by mouth daily. 30 tablet 11   Cholecalciferol 50 MCG (2000 UT) TABS 1 tab by mouth once daily 30 tablet 99   gabapentin (NEURONTIN) 100 MG capsule TAKE ONE CAPSULE BY MOUTH THREE TIMES A DAY (Patient taking differently: daily.) 90 capsule 5   Multiple Vitamin (MULTIVITAMIN) tablet Take 1 tablet by mouth daily.     OVER THE COUNTER MEDICATION as needed. TIGER BOMB TOPICAL     traMADol (ULTRAM) 50 MG tablet TAKE ONE TABLET BY MOUTH EVERY 8 HOURS AS NEEDED FOR MODERATE PAIN 60 tablet 1   famotidine (PEPCID) 20 MG tablet 1 tab by mouth twice per day as needed 180 tablet 3   meclizine (ANTIVERT) 12.5 MG tablet Take 1 tablet (12.5 mg total) by  mouth 3 (three) times daily as needed for dizziness. (Patient not taking: Reported on 09/03/2021) 40 tablet 1   predniSONE (DELTASONE) 10 MG tablet 3 tabs by mouth per day for 3 days,2tabs per day for 3 days,1tab per day for 3 days (Patient not taking: Reported on 09/03/2021) 18 tablet 0   Vitamin D, Ergocalciferol, (DRISDOL) 1.25 MG (50000 UNIT) CAPS capsule Take 1 capsule (50,000 Units total) by mouth every 7 (seven) days. (Patient not taking: Reported on 09/03/2021) 12 capsule 0   Current Facility-Administered Medications  Medication Dose Route Frequency Provider Last Rate Last Admin   0.9 %  sodium chloride infusion  500 mL Intravenous Continuous Daryel November, MD        Allergies as of 09/17/2021   (No Known Allergies)    Family  History  Problem Relation Age of Onset   Hyperlipidemia Mother    Hypertension Mother    Hyperlipidemia Father    Heart disease Father    Depression Father    Depression Brother    Prostate cancer Paternal Grandfather    Colon cancer Neg Hx    Colon polyps Neg Hx    Esophageal cancer Neg Hx    Rectal cancer Neg Hx    Stomach cancer Neg Hx     Social History   Socioeconomic History   Marital status: Married    Spouse name: Not on file   Number of children: 3   Years of education: 12   Highest education level: Not on file  Occupational History   Occupation: Sales  Tobacco Use   Smoking status: Every Day    Packs/day: 0.25    Types: Cigarettes   Smokeless tobacco: Never  Vaping Use   Vaping Use: Never used  Substance and Sexual Activity   Alcohol use: Yes    Alcohol/week: 0.0 standard drinks    Comment: occasionally   Drug use: Yes    Types: Marijuana   Sexual activity: Not on file  Other Topics Concern   Not on file  Social History Narrative   Fun: Elbert Ewings out with the kids and family time.   Denies any religious beliefs effecting health care.    Social Determinants of Health   Financial Resource Strain: Not on file  Food Insecurity: Not on file  Transportation Needs: Not on file  Physical Activity: Not on file  Stress: Not on file  Social Connections: Not on file  Intimate Partner Violence: Not on file    Review of Systems:  All other review of systems negative except as mentioned in the HPI.  Physical Exam: Vital signs BP (!) 94/51    Pulse 80    Temp 98.7 F (37.1 C)    Ht 5\' 9"  (1.753 m)    Wt 159 lb 8 oz (72.3 kg)    SpO2 98%    BMI 23.55 kg/m   General:   Alert,  Well-developed, well-nourished, pleasant and cooperative in NAD Airway:  Mallampati 2 Lungs:  Clear throughout to auscultation.   Heart:  Regular rate and rhythm; no murmurs, clicks, rubs,  or gallops. Abdomen:  Soft, nontender and nondistended. Normal bowel sounds.   Neuro/Psych:   Normal mood and affect. A and O x 3   Milli Woolridge E. Candis Schatz, MD [facility]

## 2021-09-17 NOTE — Progress Notes (Signed)
Called to room to assist during endoscopic procedure.  Patient ID and intended procedure confirmed with present staff. Received instructions for my participation in the procedure from the performing physician.  

## 2021-09-17 NOTE — Progress Notes (Signed)
VS per DT

## 2021-09-17 NOTE — Progress Notes (Signed)
A and O x3. Report to RN. Tolerated MAC anesthesia well.

## 2021-09-17 NOTE — Op Note (Signed)
[name] Procedure Date: 09/17/2021 11:07 AM MRN: [id number] Endoscopist: Nicki Reaper E. Candis Schatz , MD Age: 46 Referring MD:  Date of Birth: [birth year] Gender: Male Account #: [id number] Procedure:                Colonoscopy Indications:              Screening for colorectal malignant neoplasm, This                            is the patient's first colonoscopy Medicines:                Monitored Anesthesia Care Procedure:                Pre-Anesthesia Assessment:                           - Prior to the procedure, a History and Physical                            was performed, and patient medications and                            allergies were reviewed. The patient's tolerance of                            previous anesthesia was also reviewed. The risks                            and benefits of the procedure and the sedation                            options and risks were discussed with the patient.                            All questions were answered, and informed consent                            was obtained. Prior Anticoagulants: The patient has                            taken no previous anticoagulant or antiplatelet                            agents. ASA Grade Assessment: II - A patient with                            mild systemic disease. After reviewing the risks                            and benefits, the patient was deemed in                            satisfactory condition to undergo the procedure.  After obtaining informed consent, the colonoscope                            was passed under direct vision. Throughout the                            procedure, the patient's blood pressure, pulse, and                            oxygen saturations were monitored continuously. The                            CF [id number] #[id number] was introduced through the anus                            and advanced to the  the terminal ileum, with                            identification of the appendiceal orifice and IC                            valve. The colonoscopy was performed without                            difficulty. The patient tolerated the procedure                            well. The quality of the bowel preparation was                            adequate. The terminal ileum, ileocecal valve,                            appendiceal orifice, and rectum were photographed.                            The bowel preparation used was SUPREP via split                            dose instruction. Scope In: 11:16:33 AM Scope Out: 11:34:48 AM Scope Withdrawal Time: 0 hours 15 minutes 32 seconds  Total Procedure Duration: 0 hours 18 minutes 15 seconds  Findings:                 Hemorrhoids were found on perianal exam.                           The digital rectal exam was normal. Pertinent                            negatives include normal sphincter tone and no                            palpable rectal lesions.  A 10 mm polyp was found in the ascending colon. The                            polyp was sessile. The polyp was removed with a                            cold snare. Resection and retrieval were complete.                            Estimated blood loss was minimal.                           A 5 mm polyp was found in the sigmoid colon. The                            polyp was sessile. The polyp was removed with a                            cold snare. Resection and retrieval were complete.                            Estimated blood loss was minimal.                           The exam was otherwise normal throughout the                            examined colon.                           The terminal ileum appeared normal.                           Non-bleeding internal hemorrhoids were found during                            retroflexion. The hemorrhoids were Grade II                             (internal hemorrhoids that prolapse but reduce                            spontaneously).                           No additional abnormalities were found on                            retroflexion. Complications:            No immediate complications. Estimated Blood Loss:     Estimated blood loss was minimal. Impression:               - Hemorrhoids found on perianal exam.                           -  One 10 mm polyp in the ascending colon, removed                            with a cold snare. Resected and retrieved.                           - One 5 mm polyp in the sigmoid colon, removed with                            a cold snare. Resected and retrieved.                           - The examined portion of the ileum was normal.                           - Non-bleeding internal hemorrhoids. Recommendation:           - Patient has a contact number available for                            emergencies. The signs and symptoms of potential                            delayed complications were discussed with the                            patient. Return to normal activities tomorrow.                            Written discharge instructions were provided to the                            patient.                           - Resume previous diet.                           - Continue present medications.                           - Await pathology results.                           - Repeat colonoscopy (date not yet determined) for                            surveillance based on pathology results. James Leon E. Candis Schatz, MD 09/17/2021 11:38:41 AM This report has been signed electronically.

## 2021-09-17 NOTE — Patient Instructions (Signed)
You have 2 polyps and hemorrhoids. Awaiting pathology on polyps.   Handouts on polyps and hemorrhoids given.   Continue your normal medication.   YOU HAD AN ENDOSCOPIC PROCEDURE TODAY AT [facility]:   Refer to the procedure report that was given to you for any specific questions about what was found during the examination.  If the procedure report does not answer your questions, please call your gastroenterologist to clarify.  If you requested that your care partner not be given the details of your procedure findings, then the procedure report has been included in a sealed envelope for you to review at your convenience later.  YOU SHOULD EXPECT: Some feelings of bloating in the abdomen. Passage of more gas than usual.  Walking can help get rid of the air that was put into your GI tract during the procedure and reduce the bloating. If you had a lower endoscopy (such as a colonoscopy or flexible sigmoidoscopy) you may notice spotting of blood in your stool or on the toilet paper. If you underwent a bowel prep for your procedure, you may not have a normal bowel movement for a few days.  Please Note:  You might notice some irritation and congestion in your nose or some drainage.  This is from the oxygen used during your procedure.  There is no need for concern and it should clear up in a day or so.  SYMPTOMS TO REPORT IMMEDIATELY:  Following lower endoscopy (colonoscopy or flexible sigmoidoscopy):  Excessive amounts of blood in the stool  Significant tenderness or worsening of abdominal pains  Swelling of the abdomen that is new, acute  Fever of 100F or higher   For urgent or emergent issues, a gastroenterologist can be reached at any hour by calling [phone]. Do not use MyChart messaging for urgent concerns.    DIET:  We do recommend a small meal at first, but then you may proceed to your regular diet.  Drink plenty of fluids but you should avoid alcoholic beverages for  24 hours.  ACTIVITY:  You should plan to take it easy for the rest of today and you should NOT DRIVE or use heavy machinery until tomorrow (because of the sedation medicines used during the test).    FOLLOW UP: Our staff will call the number listed on your records 48-72 hours following your procedure to check on you and address any questions or concerns that you may have regarding the information given to you following your procedure. If we do not reach you, we will leave a message.  We will attempt to reach you two times.  During this call, we will ask if you have developed any symptoms of COVID 19. If you develop any symptoms (ie: fever, flu-like symptoms, shortness of breath, cough etc.) before then, please call [phone].  If you test positive for Covid 19 in the 2 weeks post procedure, please call and report this information to [place].    If any biopsies were taken you will be contacted by phone or by letter within the next 1-3 weeks.  Please call us at [phone] if you have not heard about the biopsies in 3 weeks.    SIGNATURES/CONFIDENTIALITY: You and/or your care partner have signed paperwork which will be entered into your electronic medical record.  These signatures attest to the fact that that the information above on your After Visit Summary has been reviewed and is understood.  Full responsibility of the confidentiality of this discharge  information lies with you and/or your care-partner.

## 2021-09-21 ENCOUNTER — Telehealth: Payer: Self-pay

## 2021-09-21 NOTE — Telephone Encounter (Signed)
Left message on follow up call. 

## 2021-09-21 NOTE — Telephone Encounter (Signed)
°  Follow up Call-  Call back number 09/17/2021  Post procedure Call Back phone  # [phone]  Permission to leave phone message Yes  Some recent data might be hidden     Patient questions:  Do you have a fever, pain , or abdominal swelling? No. Pain Score  0 *  Have you tolerated food without any problems? Yes.    Have you been able to return to your normal activities? Yes.    Do you have any questions about your discharge instructions: Diet   No. Medications  No. Follow up visit  No.  Do you have questions or concerns about your Care? No.  Actions: * If pain score is 4 or above: No action needed, pain <4.

## 2021-09-23 ENCOUNTER — Telehealth: Payer: Self-pay | Admitting: Internal Medicine

## 2021-09-23 NOTE — Progress Notes (Signed)
James Leon,   The two polyps that I removed during your recent procedure were completely benign but were proven to be "pre-cancerous" polyps that MAY have grown into cancers if they had not been removed.  Studies shows that at least 20% of women over age 46 and 30% of men over age 88 have pre-cancerous polyps.  Based on current nationally recognized surveillance guidelines, I recommend that you have a repeat colonoscopy in 3 years.   If you develop any new rectal bleeding, abdominal pain or significant bowel habit changes, please contact me before then.

## 2021-09-24 MED ORDER — ALPRAZOLAM 1 MG PO TABS: ORAL_TABLET | ORAL | 2 refills | Status: DC

## 2021-09-24 NOTE — Addendum Note (Signed)
Addended by: Biagio Borg on: 09/24/2021 05:10 PM ? ? Modules accepted: Orders ? ?

## 2021-09-24 NOTE — Telephone Encounter (Signed)
Ok done, for dispense on or after Oct 01 2021 ?

## 2021-09-24 NOTE — Telephone Encounter (Signed)
1.Medication Requested: ALPRAZolam (XANAX) 1 MG tablet ? ?2. Pharmacy (Name, Street, [facility]): [id number] - [address], [place]  ?Phone:  [phone] ?Fax:  [phone] ? ? ?3. On Med List: yes ? ?4. Last Visit with PCP: 08.26.22 ? ?5. Next visit date with PCP: n/a ? ? ?Agent: Please be advised that RX refills may take up to 3 business days. We ask that you follow-up with your pharmacy.  ?

## 2021-09-27 NOTE — Telephone Encounter (Signed)
Called patient and left voice message regarding patient medication refill request. Rx sent to pharmacy dispense on or after 10/01/21 ?

## 2021-10-18 ENCOUNTER — Other Ambulatory Visit: Payer: Self-pay | Admitting: Internal Medicine

## 2021-12-16 ENCOUNTER — Telehealth (INDEPENDENT_AMBULATORY_CARE_PROVIDER_SITE_OTHER): Payer: Self-pay | Admitting: Internal Medicine

## 2021-12-16 DIAGNOSIS — F17200 Nicotine dependence, unspecified, uncomplicated: Secondary | ICD-10-CM

## 2021-12-16 DIAGNOSIS — E7800 Pure hypercholesterolemia, unspecified: Secondary | ICD-10-CM

## 2021-12-16 DIAGNOSIS — F411 Generalized anxiety disorder: Secondary | ICD-10-CM

## 2021-12-16 DIAGNOSIS — G8929 Other chronic pain: Secondary | ICD-10-CM

## 2021-12-16 DIAGNOSIS — M545 Low back pain, unspecified: Secondary | ICD-10-CM

## 2021-12-16 DIAGNOSIS — I700 Atherosclerosis of aorta: Secondary | ICD-10-CM

## 2021-12-16 DIAGNOSIS — E559 Vitamin D deficiency, unspecified: Secondary | ICD-10-CM

## 2021-12-16 MED ORDER — TRAMADOL HCL 50 MG PO TABS: ORAL_TABLET | ORAL | 2 refills | Status: DC

## 2021-12-16 MED ORDER — ALPRAZOLAM 1 MG PO TABS: ORAL_TABLET | ORAL | 2 refills | Status: DC

## 2021-12-16 MED ORDER — ROSUVASTATIN CALCIUM 20 MG PO TABS: 20.0000 mg | ORAL_TABLET | Freq: Every day | ORAL | 3 refills | Status: DC

## 2021-12-16 NOTE — Progress Notes (Signed)
Patient ID: James Leon, male   DOB: 28-Jul-1975, 46 y.o.   MRN: [id number]  Virtual Visit via Video Note  I connected with Louie Bun on 12/19/21 at 10:40 AM EDT by a video enabled telemedicine application and verified that I am speaking with the correct person using two identifiers.  Location of all participants today Patient: at home Provider: at office   I discussed the limitations of evaluation and management by telemedicine and the availability of in person appointments. The patient expressed understanding and agreed to proceed.  History of Present Illness: Overall doing ok, not taking Vit D, Pt continues to have recurring LBP without change in severity, bowel or bladder change, fever, wt loss,  worsening LE pain/numbness/weakness, gait change or falls.  Still smoking not ready to quit. Denies worsening depressive symptoms, suicidal ideation, or panic; has ongoing anxiety,needs med refill.  Also has noted aortic atherosclerosis on imaging recently, willing to start statin.  Pt denies chest pain, increased sob or doe, wheezing, orthopnea, PND, increased LE swelling, palpitations, dizziness or syncope.   Pt denies polydipsia, polyuria, or new focal neuro s/s.    Past Medical History:  Diagnosis Date   Allergy    Anxiety    GERD (gastroesophageal reflux disease)    Hyperlipidemia    Hypertension    Past Surgical History:  Procedure Laterality Date   LUMP REMOVED FROM CHEEK Right    1 1/2 YEAR AGO AS OF 09/03/21    reports that he has been smoking cigarettes. He has been smoking an average of .25 packs per day. He has never used smokeless tobacco. He reports current alcohol use. He reports current drug use. Drug: Marijuana. family history includes Depression in his brother and father; Heart disease in his father; Hyperlipidemia in his father and mother; Hypertension in his mother; Prostate cancer in his paternal grandfather. No Known Allergies Current Outpatient Medications on File  Prior to Visit  Medication Sig Dispense Refill   cetirizine (ZYRTEC) 10 MG tablet Take 1 tablet (10 mg total) by mouth daily. 30 tablet 11   Cholecalciferol 50 MCG (2000 UT) TABS 1 tab by mouth once daily 30 tablet 99   famotidine (PEPCID) 20 MG tablet 1 tab by mouth twice per day as needed 180 tablet 3   gabapentin (NEURONTIN) 100 MG capsule TAKE ONE CAPSULE BY MOUTH THREE TIMES A DAY (Patient taking differently: daily.) 90 capsule 5   Multiple Vitamin (MULTIVITAMIN) tablet Take 1 tablet by mouth daily.     OVER THE COUNTER MEDICATION as needed. TIGER BOMB TOPICAL     No current facility-administered medications on file prior to visit.    Observations/Objective: Alert, NAD, appropriate mood and affect, resps normal, cn 2-12 intact, moves all 4s, no visible rash or swelling Lab Results  Component Value Date   WBC 11.5 (H) 03/19/2021   HGB 16.2 03/19/2021   HCT 47.2 03/19/2021   PLT 269 03/19/2021   GLUCOSE 108 (H) 03/19/2021   CHOL 257 (H) 03/19/2021   TRIG 98 03/19/2021   HDL 67 03/19/2021   LDLDIRECT 126.6 07/05/2010   LDLCALC 168 (H) 03/19/2021   ALT 17 03/19/2021   AST 15 03/19/2021   NA 141 03/19/2021   K 4.5 03/19/2021   CL 101 03/19/2021   CREATININE 0.94 03/19/2021   BUN 11 03/19/2021   CO2 31 03/19/2021   TSH 0.91 03/19/2021   PSA 1.03 03/19/2021   Assessment and Plan: See notes  Follow Up Instructions: See notes   I  discussed the assessment and treatment plan with the patient. The patient was provided an opportunity to ask questions and all were answered. The patient agreed with the plan and demonstrated an understanding of the instructions.   The patient was advised to call back or seek an in-person evaluation if the symptoms worsen or if the condition fails to improve as anticipated.   Cathlean Cower, MD

## 2021-12-19 ENCOUNTER — Encounter: Payer: Self-pay | Admitting: Internal Medicine

## 2021-12-19 DIAGNOSIS — I700 Atherosclerosis of aorta: Secondary | ICD-10-CM | POA: Insufficient documentation

## 2021-12-19 NOTE — Assessment & Plan Note (Signed)
Last vitamin D Lab Results  Component Value Date   VD25OH 30 03/19/2021   Low, to start oral replacement

## 2021-12-19 NOTE — Assessment & Plan Note (Signed)
Lab Results  Component Value Date   LDLCALC 168 (H) 03/19/2021   Severe, for start crestor 20 , low chol diet

## 2021-12-19 NOTE — Assessment & Plan Note (Signed)
Pt for stop smoking, start crestor, low chol diet, excercise

## 2021-12-19 NOTE — Assessment & Plan Note (Signed)
Stable, for med refill,  to f/u any worsening symptoms or concerns 

## 2021-12-19 NOTE — Assessment & Plan Note (Signed)
Pt counsled to quit, pt not ready °

## 2021-12-19 NOTE — Patient Instructions (Signed)
Please take OTC Vitamin D3 at 2000 units per day, indefinitely  Please take all new medication as prescribed - the crestor 20 mg  Please continue all other medications as before, and refills have been done if requested.  Please have the pharmacy call with any other refills you may need.  Please continue your efforts at being more active, low cholesterol diet, and weight control.  Please keep your appointments with your specialists as you may have planned  Please quit smoking

## 2021-12-19 NOTE — Assessment & Plan Note (Signed)
Chronic recurrent, for tramadol and gabapentin prn

## 2022-02-16 ENCOUNTER — Other Ambulatory Visit: Payer: Self-pay | Admitting: Internal Medicine

## 2022-03-18 ENCOUNTER — Other Ambulatory Visit: Payer: Self-pay | Admitting: Internal Medicine

## 2022-03-31 ENCOUNTER — Other Ambulatory Visit: Payer: Self-pay | Admitting: Internal Medicine

## 2022-04-05 ENCOUNTER — Other Ambulatory Visit: Payer: Self-pay | Admitting: Internal Medicine

## 2022-04-17 IMAGING — CT CT ABD-PELV W/O CM
2 of 4 series · 13 of 46 positions shown, 15 images · non-contrast
Comparison: 03/08/2019

CLINICAL DATA: Hematuria, right lower quadrant tenderness

EXAM:
CT ABDOMEN AND PELVIS WITHOUT CONTRAST
TECHNIQUE: Multidetector CT imaging of the abdomen and pelvis was performed
following the standard protocol without IV contrast.

[Series 2: routine abdomen pelvis without 5.00 br40 s3 axial · axial · non-contrast · 0.52mm/px · z∈[+1131,+1481]mm · 10 of 84 slices shown, 12 images]
[im 7/84  soft-tissue]
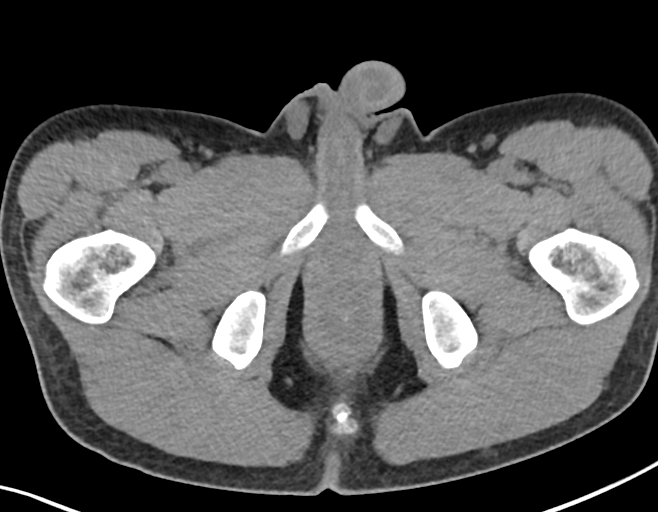
[im 7/84  bone]
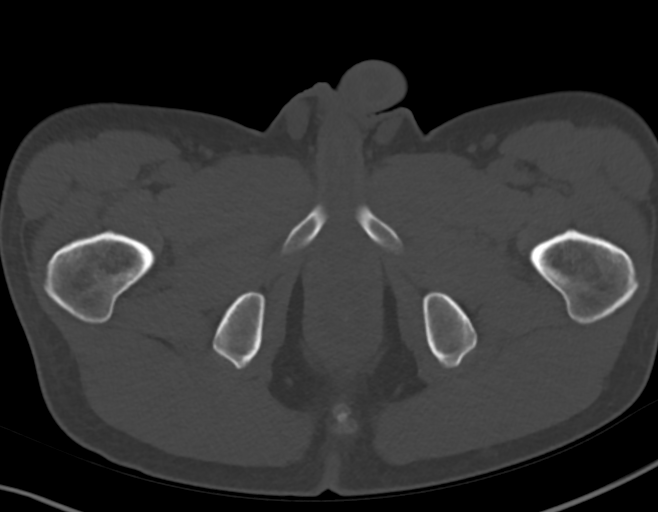
[im 14/84  soft-tissue]
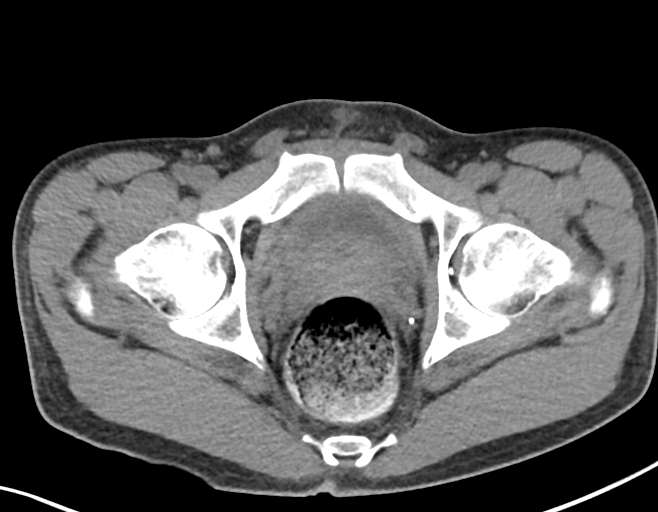
[im 24/84  soft-tissue]
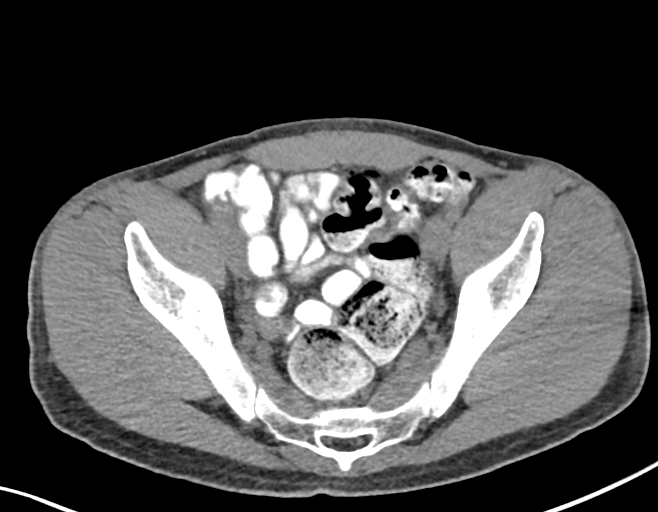
[im 30/84  soft-tissue]
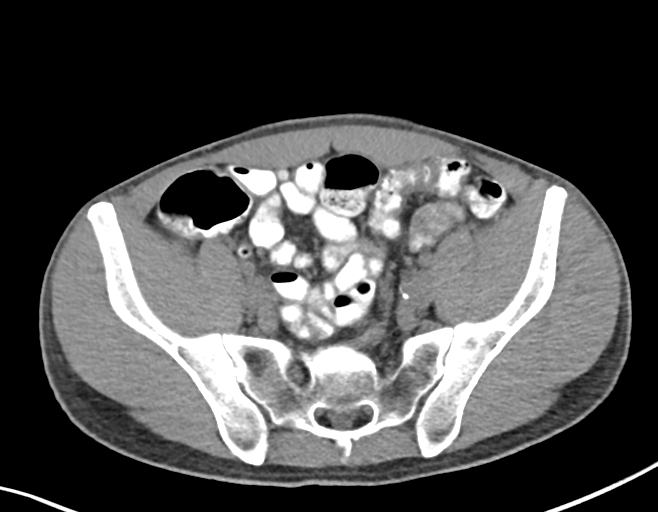
[im 37/84  soft-tissue]
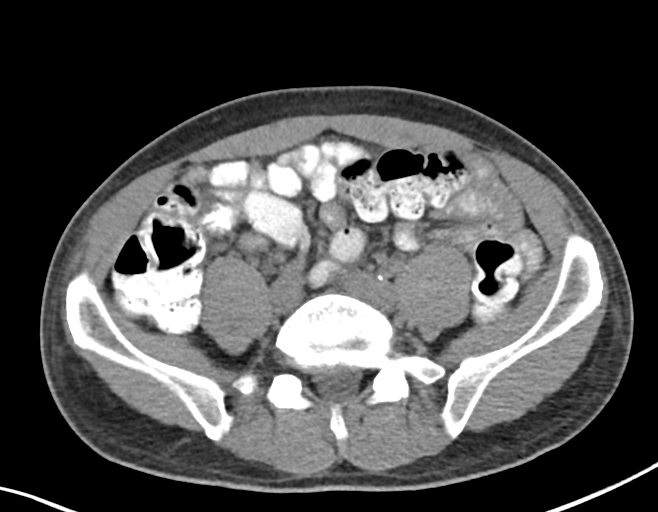
[im 47/84  soft-tissue]
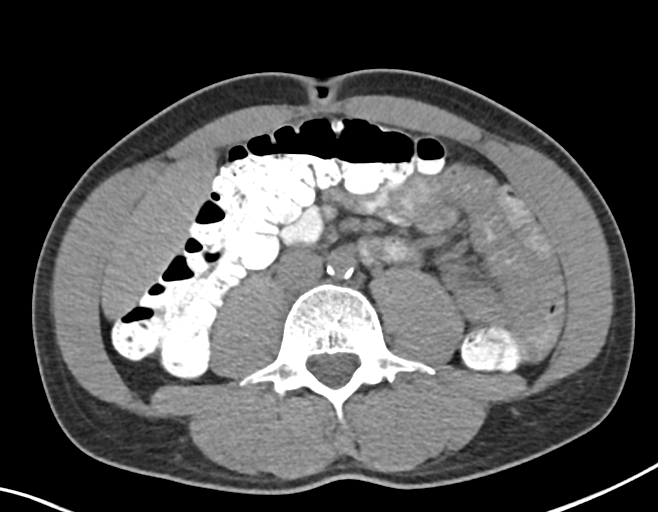
[im 54/84  soft-tissue]
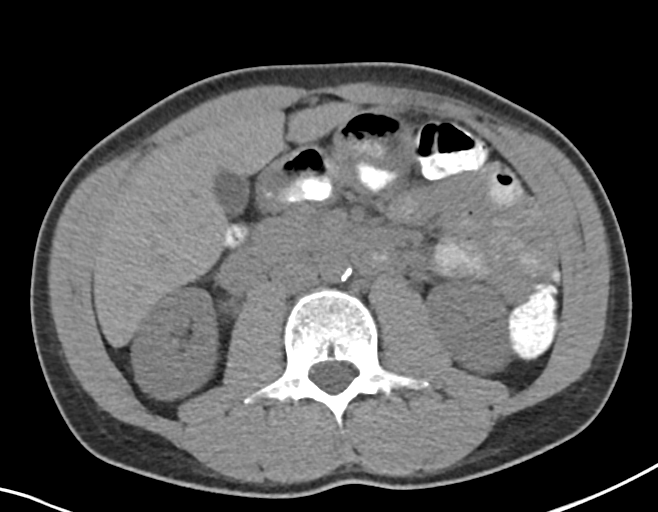
[im 64/84  soft-tissue]
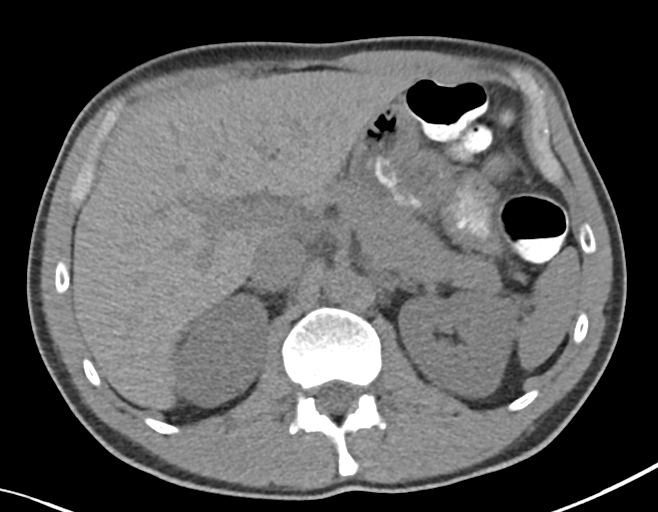
[im 70/84  soft-tissue]
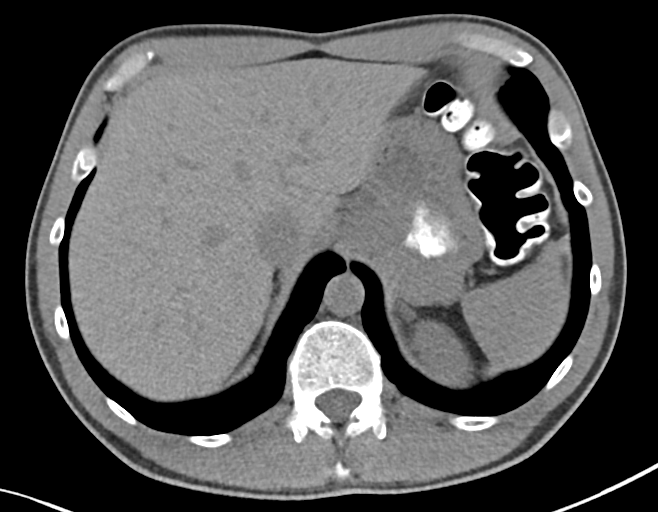
[im 70/84  bone]
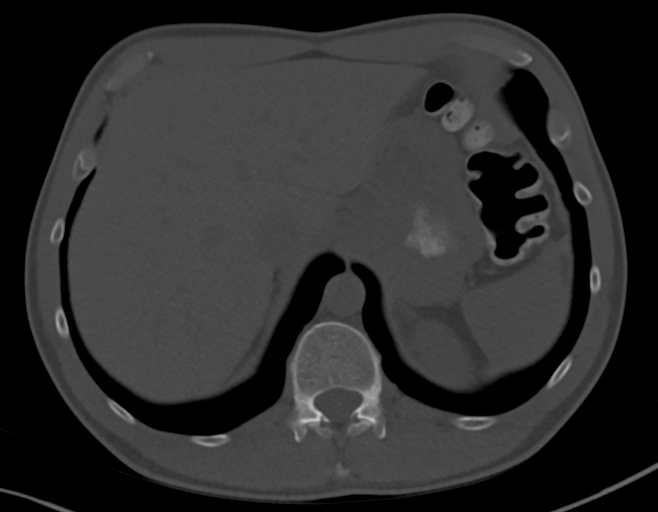
[im 77/84  soft-tissue]
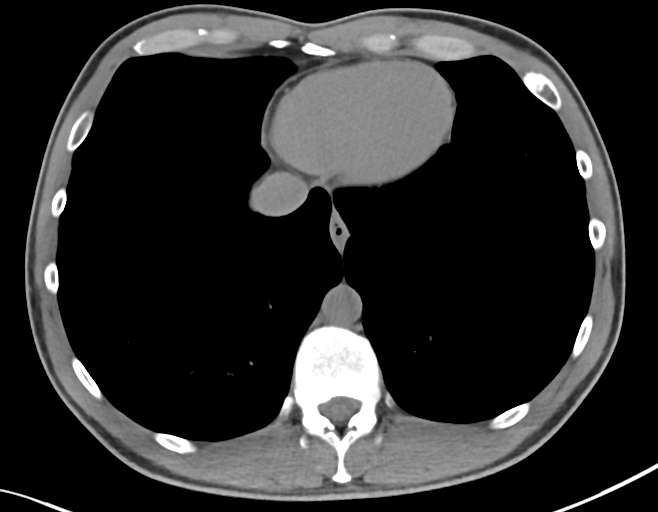

[Series 4: routine abdomen pelvis without 2.00 br40 s3 cor · coronal · non-contrast · 0.65mm/px · 3 of 133 slices shown]
[im 45/133  soft-tissue]
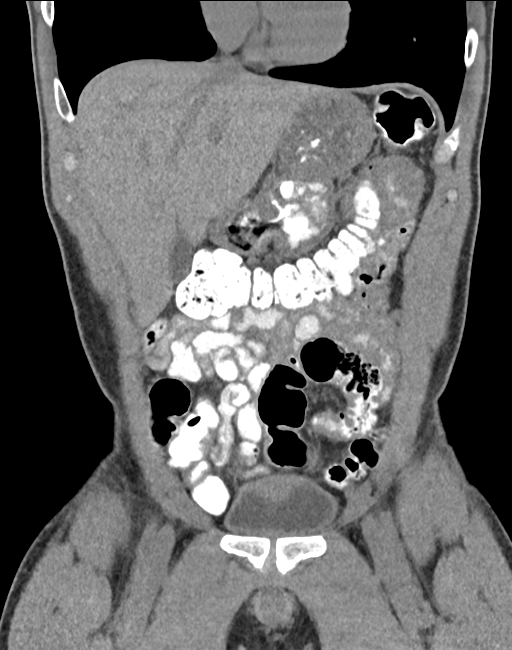
[im 59/133  soft-tissue]
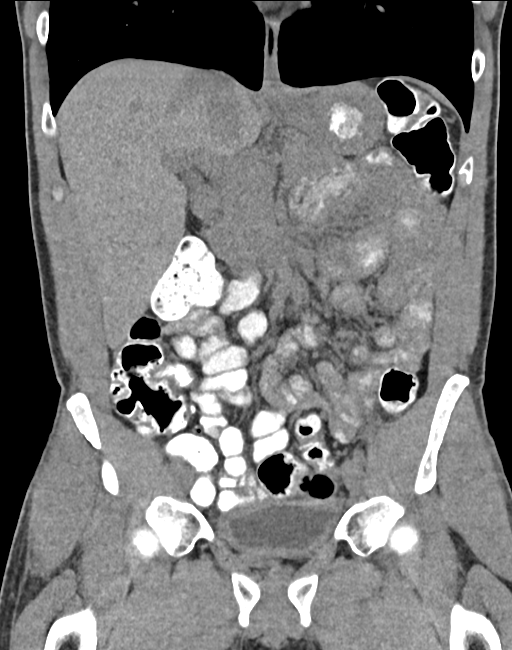
[im 74/133  soft-tissue]
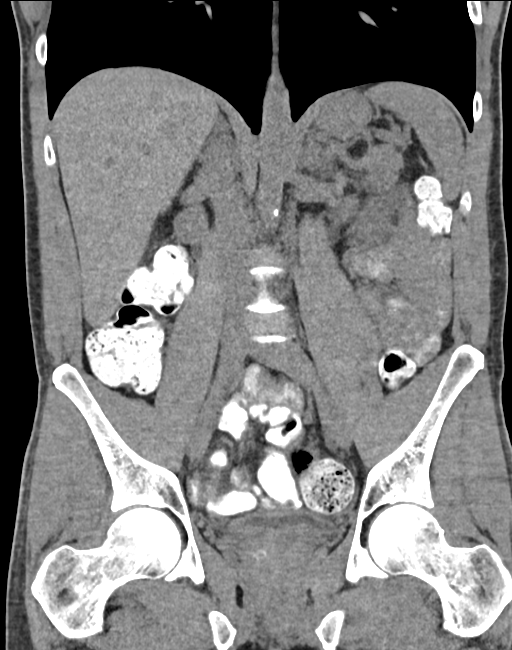

[13 of 46 positions shown; findings below may reference images not displayed]

FINDINGS: Lower chest: No acute abnormality.

Hepatobiliary: Limited without IV contrast. Similar left hepatic
hypodense cyst measures 12 mm, image [DATE]. No other large hepatic
abnormality or biliary obstruction pattern. Gallbladder collapsed.
Common bile duct nondilated.

Pancreas: Unremarkable. No pancreatic ductal dilatation or
surrounding inflammatory changes.

Spleen: Normal in size without focal abnormality.

Adrenals/Urinary Tract: Adrenal glands are unremarkable. Kidneys are
normal, without renal calculi, focal lesion, or hydronephrosis.
Bladder is unremarkable.

Stomach/Bowel: Stomach is within normal limits. Appendix appears
normal. No evidence of bowel wall thickening, distention, or
inflammatory changes.

Vascular/Lymphatic: Limited without IV contrast. Minor aortic
atherosclerosis. Negative for aneurysm. No bulky adenopathy.

Reproductive: Prostate calcifications noted as before. No other
significant finding by CT.

Other: No abdominal wall hernia or abnormality. No abdominopelvic
ascites.

Musculoskeletal: Degenerative disc disease noted at L5-S1 with disc
space narrowing. No acute osseous finding.
IMPRESSION: No acute intra-abdominal or pelvic finding by noncontrast CT. No
evidence of urinary tract calculi, acute obstructive uropathy, or
hydronephrosis.

Aortic Atherosclerosis ([id number].C).

## 2022-04-23 ENCOUNTER — Other Ambulatory Visit: Payer: Self-pay | Admitting: Internal Medicine

## 2022-04-26 MED ORDER — TRAMADOL HCL 50 MG PO TABS: ORAL_TABLET | ORAL | 1 refills | Status: DC

## 2022-05-13 DIAGNOSIS — R519 Headache, unspecified: Secondary | ICD-10-CM | POA: Diagnosis not present

## 2022-05-13 DIAGNOSIS — R509 Fever, unspecified: Secondary | ICD-10-CM | POA: Diagnosis not present

## 2022-05-13 DIAGNOSIS — M791 Myalgia, unspecified site: Secondary | ICD-10-CM | POA: Diagnosis not present

## 2022-05-13 DIAGNOSIS — F17210 Nicotine dependence, cigarettes, uncomplicated: Secondary | ICD-10-CM | POA: Diagnosis not present

## 2022-05-13 DIAGNOSIS — R112 Nausea with vomiting, unspecified: Secondary | ICD-10-CM | POA: Diagnosis not present

## 2022-05-13 DIAGNOSIS — R5383 Other fatigue: Secondary | ICD-10-CM | POA: Diagnosis not present

## 2022-05-13 DIAGNOSIS — U071 COVID-19: Secondary | ICD-10-CM | POA: Diagnosis not present

## 2022-05-30 ENCOUNTER — Telehealth: Payer: Self-pay | Admitting: Internal Medicine

## 2022-05-30 NOTE — Telephone Encounter (Signed)
Pt called with problem getting prescription:  TRAMADOL had one refill. Pt moved to [place] so he tried to fill it there. The pharmacy there does not allow him to use his discount card, so it costs $70 unless prior auth.  The pharmacy in [place] told him the medication is not allowed to be transferred again.  REQUEST:  Advise patient of options. Can it be transferred again?  Does RX need voided and a new one sent?  Any other suggestions?  Transfer back to [facility], [place]? There was no out of pocket cost to him there.  Please all pt [phone].

## 2022-05-31 MED ORDER — TRAMADOL HCL 50 MG PO TABS: ORAL_TABLET | ORAL | 1 refills | Status: DC

## 2022-05-31 NOTE — Telephone Encounter (Signed)
Ok I re-sent a new rx to [facility] [place]; ok to cancel his current one that was transferred

## 2022-06-14 ENCOUNTER — Other Ambulatory Visit: Payer: Self-pay | Admitting: Internal Medicine

## 2022-07-28 ENCOUNTER — Other Ambulatory Visit: Payer: Self-pay | Admitting: Internal Medicine

## 2022-08-11 ENCOUNTER — Ambulatory Visit (INDEPENDENT_AMBULATORY_CARE_PROVIDER_SITE_OTHER): Payer: BC Managed Care – PPO | Admitting: Internal Medicine

## 2022-08-11 ENCOUNTER — Ambulatory Visit (INDEPENDENT_AMBULATORY_CARE_PROVIDER_SITE_OTHER): Payer: BC Managed Care – PPO

## 2022-08-11 ENCOUNTER — Encounter: Payer: Self-pay | Admitting: Internal Medicine

## 2022-08-11 VITALS — BP 100/60 | HR 88 | Temp 97.4°F | Ht 69.0 in | Wt 153.0 lb

## 2022-08-11 DIAGNOSIS — F32A Depression, unspecified: Secondary | ICD-10-CM | POA: Insufficient documentation

## 2022-08-11 DIAGNOSIS — F323 Major depressive disorder, single episode, severe with psychotic features: Secondary | ICD-10-CM | POA: Diagnosis not present

## 2022-08-11 DIAGNOSIS — M5441 Lumbago with sciatica, right side: Secondary | ICD-10-CM

## 2022-08-11 DIAGNOSIS — R6883 Chills (without fever): Secondary | ICD-10-CM | POA: Diagnosis not present

## 2022-08-11 DIAGNOSIS — Z23 Encounter for immunization: Secondary | ICD-10-CM

## 2022-08-11 DIAGNOSIS — R410 Disorientation, unspecified: Secondary | ICD-10-CM | POA: Diagnosis not present

## 2022-08-11 DIAGNOSIS — E559 Vitamin D deficiency, unspecified: Secondary | ICD-10-CM

## 2022-08-11 DIAGNOSIS — F17200 Nicotine dependence, unspecified, uncomplicated: Secondary | ICD-10-CM

## 2022-08-11 DIAGNOSIS — G8929 Other chronic pain: Secondary | ICD-10-CM

## 2022-08-11 DIAGNOSIS — J069 Acute upper respiratory infection, unspecified: Secondary | ICD-10-CM

## 2022-08-11 MED ORDER — DOXYCYCLINE HYCLATE 100 MG PO TABS: 100.0000 mg | ORAL_TABLET | Freq: Two times a day (BID) | ORAL | 0 refills | Status: DC

## 2022-08-11 NOTE — Patient Instructions (Signed)
Please take all new medication as prescribed - the antibiotic  Please continue all other medications as before, and refills have been done if requested.  Please have the pharmacy call with any other refills you may need.  Please keep your appointments with your specialists as you may have planned  You will be contacted regarding the referral for: Psychiatry  Please go to the XRAY Department in the first floor for the x-ray testing  Please go to the LAB at the blood drawing area for the tests to be done  You will be contacted by phone if any changes need to be made immediately.  Otherwise, you will receive a letter about your results with an explanation, but please check with MyChart first.  Please remember to sign up for MyChart if you have not done so, as this will be important to you in the future with finding out test results, communicating by private email, and scheduling acute appointments online when needed.

## 2022-08-12 ENCOUNTER — Encounter: Payer: Self-pay | Admitting: Internal Medicine

## 2022-08-12 LAB — CBC WITH DIFFERENTIAL/PLATELET
Basophils Absolute: 0.1 10*3/uL (ref 0.0–0.1)
Basophils Relative: 1.1 % (ref 0.0–3.0)
Eosinophils Absolute: 0.2 10*3/uL (ref 0.0–0.7)
Eosinophils Relative: 1.8 % (ref 0.0–5.0)
HCT: 43.2 % (ref 39.0–52.0)
Hemoglobin: 14.3 g/dL (ref 13.0–17.0)
Lymphocytes Relative: 45.6 % (ref 12.0–46.0)
Lymphs Abs: 5.5 10*3/uL — ABNORMAL HIGH (ref 0.7–4.0)
MCHC: 33.2 g/dL (ref 30.0–36.0)
MCV: 93.6 fl (ref 78.0–100.0)
Monocytes Absolute: 1 10*3/uL (ref 0.1–1.0)
Monocytes Relative: 8.4 % (ref 3.0–12.0)
Neutro Abs: 5.2 10*3/uL (ref 1.4–7.7)
Neutrophils Relative %: 43.1 % (ref 43.0–77.0)
Platelets: 206 10*3/uL (ref 150.0–400.0)
RBC: 4.62 Mil/uL (ref 4.22–5.81)
RDW: 13.5 % (ref 11.5–15.5)
WBC: 12 10*3/uL — ABNORMAL HIGH (ref 4.0–10.5)

## 2022-08-12 LAB — URINALYSIS, ROUTINE W REFLEX MICROSCOPIC
Bilirubin Urine: NEGATIVE
Ketones, ur: NEGATIVE
Leukocytes,Ua: NEGATIVE
Nitrite: NEGATIVE
Specific Gravity, Urine: 1.01 (ref 1.000–1.030)
Total Protein, Urine: NEGATIVE
Urine Glucose: NEGATIVE
Urobilinogen, UA: 0.2 (ref 0.0–1.0)
WBC, UA: NONE SEEN (ref 0–?)
pH: 5.5 (ref 5.0–8.0)

## 2022-08-12 LAB — BASIC METABOLIC PANEL
BUN: 18 mg/dL (ref 6–23)
CO2: 32 mEq/L (ref 19–32)
Calcium: 9.5 mg/dL (ref 8.4–10.5)
Chloride: 99 mEq/L (ref 96–112)
Creatinine, Ser: 1.1 mg/dL (ref 0.40–1.50)
GFR: 80.56 mL/min (ref >60.00)
Glucose, Bld: 98 mg/dL (ref 70–99)
Potassium: 4.2 mEq/L (ref 3.5–5.1)
Sodium: 139 mEq/L (ref 135–145)

## 2022-08-12 LAB — HEPATIC FUNCTION PANEL
ALT: 11 U/L (ref 0–53)
AST: 16 U/L (ref 0–37)
Albumin: 4.7 g/dL (ref 3.5–5.2)
Alkaline Phosphatase: 42 U/L (ref 39–117)
Bilirubin, Direct: 0.1 mg/dL (ref 0.0–0.3)
Total Bilirubin: 0.5 mg/dL (ref 0.2–1.2)
Total Protein: 7.3 g/dL (ref 6.0–8.3)

## 2022-08-12 LAB — TSH: TSH: 1.17 u[IU]/mL (ref 0.35–5.50)

## 2022-08-12 LAB — VITAMIN B12: Vitamin B-12: 249 pg/mL (ref 211–911)

## 2022-08-12 NOTE — Assessment & Plan Note (Signed)
?  Mild worsening recently, pt quite accepting of referral psychiatry, declines specific tx for now, no SI or HI

## 2022-08-12 NOTE — Assessment & Plan Note (Signed)
Mild to mod flare, exam benign, has appt f/u Dr Tonita Cong ortho soon,  to f/u any worsening symptoms or concerns

## 2022-08-12 NOTE — Progress Notes (Signed)
Patient ID: James Leon, male   DOB: January 06, 1976, 47 y.o.   MRN: [id number]        Chief Complaint: follow up URI symptoms, confusion, right lower back pain, anxiety depression, smoker       HPI:  James Leon is a 47 y.o. male  Here with 2-3 days acute onset fever, facial pain, pressure,  general weakness and malaise, and greenish d/c, with mild ST and cough, but pt denies HA, neck pain, chest pain, wheezing, increased sob or doe, orthopnea, PND, increased LE swelling, palpitations, dizziness or syncope.  Also however with unusual psychological issues with difficutly sleeping, intermittent confusion and memory changes, vivid dreams all for 3-4 days.  Had Covid infection 1 mo ago but seemed resolved before this.  Still smoking, not ready to quit.  Denies any alcohol or illicit drug use.  Wife has also been ill, has appt tomorrow.  Works outside [employer] with frequent multiple in person contacts daily.  Pt continues to have 2 months recurring right LBP without  bowel or bladder change, fever, wt loss,  worsening LE pain/numbness/weakness, gait change or falls.       Wt Readings from Last 3 Encounters:  08/11/22 153 lb (69.4 kg)  09/17/21 159 lb 8 oz (72.3 kg)  09/03/21 159 lb 8 oz (72.3 kg)   BP Readings from Last 3 Encounters:  08/11/22 100/60  09/17/21 (!) 145/95  03/19/21 110/68         Past Medical History:  Diagnosis Date   Allergy    Anxiety    GERD (gastroesophageal reflux disease)    Hyperlipidemia    Hypertension    Past Surgical History:  Procedure Laterality Date   LUMP REMOVED FROM CHEEK Right    1 1/2 YEAR AGO AS OF 09/03/21    reports that he has been smoking cigarettes. He has been smoking an average of .25 packs per day. He has never used smokeless tobacco. He reports current alcohol use. He reports current drug use. Drug: Marijuana. family history includes Depression in his brother and father; Heart disease in his father; Hyperlipidemia in his father and mother;  Hypertension in his mother; Prostate cancer in his paternal grandfather. No Known Allergies Current Outpatient Medications on File Prior to Visit  Medication Sig Dispense Refill   ALPRAZolam (XANAX) 1 MG tablet Take 1 tablet by mouth twice daily as needed 60 tablet 2   cetirizine (ZYRTEC) 10 MG tablet TAKE ONE TABLET BY MOUTH DAILY 30 tablet 11   Cholecalciferol 50 MCG (2000 UT) TABS 1 tab by mouth once daily 30 tablet 99   famotidine (PEPCID) 20 MG tablet 1 tab by mouth twice per day as needed 180 tablet 3   gabapentin (NEURONTIN) 100 MG capsule TAKE ONE CAPSULE BY MOUTH THREE TIMES A DAY (Patient taking differently: daily.) 90 capsule 5   Multiple Vitamin (MULTIVITAMIN) tablet Take 1 tablet by mouth daily.     OVER THE COUNTER MEDICATION as needed. TIGER BOMB TOPICAL     rosuvastatin (CRESTOR) 20 MG tablet Take 1 tablet (20 mg total) by mouth daily. 90 tablet 3   traMADol (ULTRAM) 50 MG tablet TAKE ONE TABLET BY MOUTH TWICE A DAY AS NEEDED FOR PAIN 60 tablet 0   No current facility-administered medications on file prior to visit.        ROS:  All others reviewed and negative.  Objective        PE:  BP 100/60 (BP Location: Right Arm, Patient Position: Sitting,  Cuff Size: Normal)   Pulse 88   Temp (!) 97.4 F (36.3 C) (Oral)   Ht '5\' 9"'$  (1.753 m)   Wt 153 lb (69.4 kg)   SpO2 95%   BMI 22.59 kg/m                 Constitutional: Pt appears in NAD               HENT: Head: NCAT.                Right Ear: External ear normal.                 Left Ear: External ear normal.  Bilat tm's with mild erythema.  Max sinus areas non tender.  Pharynx with mild erythema, no exudate               Eyes: . Pupils are equal, round, and reactive to light. Conjunctivae and EOM are normal               Nose: without d/c or deformity               Neck: Neck supple. Gross normal ROM               Cardiovascular: Normal rate and regular rhythm.                 Pulmonary/Chest: Effort normal and breath  sounds without rales or wheezing.                Abd:  Soft, NT, ND, + BS, no organomegaly               Neurological: Pt is alert. At baseline orientation, motor grossly intact               Skin: Skin is warm. No rashes, no other new lesions, LE edema - none               Psychiatric: Pt behavior is normal without agitation, depressed affect, nervous   Micro: none  Cardiac tracings I have personally interpreted today:  none  Pertinent Radiological findings (summarize): none   Lab Results  Component Value Date   WBC 12.0 (H) 08/11/2022   HGB 14.3 08/11/2022   HCT 43.2 08/11/2022   PLT 206.0 08/11/2022   GLUCOSE 98 08/11/2022   CHOL 257 (H) 03/19/2021   TRIG 98 03/19/2021   HDL 67 03/19/2021   LDLDIRECT 126.6 07/05/2010   LDLCALC 168 (H) 03/19/2021   ALT 11 08/11/2022   AST 16 08/11/2022   NA 139 08/11/2022   K 4.2 08/11/2022   CL 99 08/11/2022   CREATININE 1.10 08/11/2022   BUN 18 08/11/2022   CO2 32 08/11/2022   TSH 1.17 08/11/2022   PSA 1.03 03/19/2021   Assessment/Plan:  James Leon is a 47 y.o. Asian [4] White or Caucasian [1] male with  has a past medical history of Allergy, Anxiety, GERD (gastroesophageal reflux disease), Hyperlipidemia, and Hypertension.  Acute upper respiratory infection Mild to mod, for antibx course doxycycline 100 bid,,  to f/u any worsening symptoms or concerns  Confusion No evidence today but can't r/o infectious related, has no s/s HA, high fever, neck pain - cont to follow, also for cxr today and labs as ordered including cbc  Low back pain Mild to mod flare, exam benign, has appt f/u Dr Tonita Cong ortho soon,  to f/u any worsening symptoms or concerns   TOBACCO USE  Pt counsled to quit, pt not ready  Vitamin D deficiency Last vitamin D Lab Results  Component Value Date   VD25OH 30 03/19/2021   Low, reminded to start oral replacement   Depression ? Mild worsening recently, pt quite accepting of referral psychiatry, declines  specific tx for now, no SI or HI  Followup: Return if symptoms worsen or fail to improve.  Cathlean Cower, MD 08/12/2022 9:12 PM [facility]

## 2022-08-12 NOTE — Assessment & Plan Note (Signed)
Mild to mod, for antibx course doxycycline 100 bid,,  to f/u any worsening symptoms or concerns 

## 2022-08-12 NOTE — Assessment & Plan Note (Signed)
Last vitamin D Lab Results  Component Value Date   VD25OH 30 03/19/2021   Low, reminded to start oral replacement

## 2022-08-12 NOTE — Assessment & Plan Note (Addendum)
No evidence today but can't r/o infectious related, has no s/s HA, high fever, neck pain - cont to follow, also for cxr today and labs as ordered including cbc

## 2022-08-12 NOTE — Assessment & Plan Note (Signed)
Pt counsled to quit, pt not ready °

## 2022-08-29 ENCOUNTER — Other Ambulatory Visit: Payer: Self-pay | Admitting: Internal Medicine

## 2022-09-15 ENCOUNTER — Encounter: Payer: Self-pay | Admitting: Internal Medicine

## 2022-09-15 MED ORDER — ALPRAZOLAM 1 MG PO TABS: ORAL_TABLET | ORAL | 2 refills | Status: DC

## 2022-11-25 ENCOUNTER — Other Ambulatory Visit: Payer: Self-pay | Admitting: Internal Medicine

## 2022-12-01 ENCOUNTER — Encounter: Payer: Self-pay | Admitting: Internal Medicine

## 2022-12-01 MED ORDER — TRAMADOL HCL 50 MG PO TABS: ORAL_TABLET | ORAL | 2 refills | Status: DC

## 2022-12-01 NOTE — Telephone Encounter (Signed)
Done erx 

## 2022-12-05 ENCOUNTER — Other Ambulatory Visit: Payer: Self-pay | Admitting: Internal Medicine

## 2023-02-26 ENCOUNTER — Other Ambulatory Visit: Payer: Self-pay | Admitting: Internal Medicine

## 2023-02-28 ENCOUNTER — Other Ambulatory Visit: Payer: Self-pay | Admitting: Internal Medicine

## 2023-03-03 ENCOUNTER — Other Ambulatory Visit: Payer: Self-pay | Admitting: Internal Medicine

## 2023-03-27 ENCOUNTER — Other Ambulatory Visit: Payer: Self-pay | Admitting: Internal Medicine

## 2023-03-28 ENCOUNTER — Other Ambulatory Visit: Payer: Self-pay

## 2023-04-23 ENCOUNTER — Other Ambulatory Visit: Payer: Self-pay | Admitting: Internal Medicine

## 2023-05-04 ENCOUNTER — Other Ambulatory Visit: Payer: Self-pay | Admitting: Internal Medicine

## 2023-05-22 ENCOUNTER — Other Ambulatory Visit: Payer: Self-pay | Admitting: Internal Medicine

## 2023-05-31 ENCOUNTER — Encounter: Payer: BC Managed Care – PPO | Admitting: Internal Medicine

## 2023-06-01 ENCOUNTER — Other Ambulatory Visit: Payer: Self-pay | Admitting: Internal Medicine

## 2023-06-19 ENCOUNTER — Ambulatory Visit: Payer: BLUE CROSS/BLUE SHIELD | Admitting: Internal Medicine

## 2023-06-19 ENCOUNTER — Encounter: Payer: Self-pay | Admitting: Internal Medicine

## 2023-06-19 VITALS — BP 120/78 | HR 63 | Temp 98.2°F | Ht 69.0 in | Wt 151.0 lb

## 2023-06-19 DIAGNOSIS — E7800 Pure hypercholesterolemia, unspecified: Secondary | ICD-10-CM

## 2023-06-19 DIAGNOSIS — E559 Vitamin D deficiency, unspecified: Secondary | ICD-10-CM | POA: Diagnosis not present

## 2023-06-19 DIAGNOSIS — E538 Deficiency of other specified B group vitamins: Secondary | ICD-10-CM | POA: Diagnosis not present

## 2023-06-19 DIAGNOSIS — F17200 Nicotine dependence, unspecified, uncomplicated: Secondary | ICD-10-CM | POA: Diagnosis not present

## 2023-06-19 DIAGNOSIS — Z23 Encounter for immunization: Secondary | ICD-10-CM

## 2023-06-19 DIAGNOSIS — R079 Chest pain, unspecified: Secondary | ICD-10-CM | POA: Diagnosis not present

## 2023-06-19 DIAGNOSIS — Z125 Encounter for screening for malignant neoplasm of prostate: Secondary | ICD-10-CM | POA: Diagnosis not present

## 2023-06-19 DIAGNOSIS — Z0001 Encounter for general adult medical examination with abnormal findings: Secondary | ICD-10-CM

## 2023-06-19 LAB — HEPATIC FUNCTION PANEL
ALT: 15 U/L (ref 0–53)
AST: 17 U/L (ref 0–37)
Albumin: 4.9 g/dL (ref 3.5–5.2)
Alkaline Phosphatase: 47 U/L (ref 39–117)
Bilirubin, Direct: 0.1 mg/dL (ref 0.0–0.3)
Total Bilirubin: 0.6 mg/dL (ref 0.2–1.2)
Total Protein: 7.4 g/dL (ref 6.0–8.3)

## 2023-06-19 LAB — LIPID PANEL
Cholesterol: 248 mg/dL — ABNORMAL HIGH (ref 0–200)
HDL: 66.6 mg/dL (ref >39.00)
LDL Cholesterol: 161 mg/dL — ABNORMAL HIGH (ref 0–99)
NonHDL: 181.51
Total CHOL/HDL Ratio: 4
Triglycerides: 102 mg/dL (ref 0.0–149.0)
VLDL: 20.4 mg/dL (ref 0.0–40.0)

## 2023-06-19 LAB — URINALYSIS, ROUTINE W REFLEX MICROSCOPIC
Bilirubin Urine: NEGATIVE
Ketones, ur: NEGATIVE
Leukocytes,Ua: NEGATIVE
Nitrite: NEGATIVE
Specific Gravity, Urine: <=1.005 — AB (ref 1.000–1.030)
Total Protein, Urine: NEGATIVE
Urine Glucose: NEGATIVE
Urobilinogen, UA: 0.2 (ref 0.0–1.0)
pH: 6 (ref 5.0–8.0)

## 2023-06-19 LAB — VITAMIN D 25 HYDROXY (VIT D DEFICIENCY, FRACTURES): VITD: 25.59 ng/mL — ABNORMAL LOW (ref 30.00–100.00)

## 2023-06-19 LAB — BASIC METABOLIC PANEL
BUN: 11 mg/dL (ref 6–23)
CO2: 29 meq/L (ref 19–32)
Calcium: 9.9 mg/dL (ref 8.4–10.5)
Chloride: 98 meq/L (ref 96–112)
Creatinine, Ser: 1.01 mg/dL (ref 0.40–1.50)
GFR: 88.71 mL/min (ref >60.00)
Glucose, Bld: 79 mg/dL (ref 70–99)
Potassium: 4.2 meq/L (ref 3.5–5.1)
Sodium: 139 meq/L (ref 135–145)

## 2023-06-19 LAB — TSH: TSH: 2.74 u[IU]/mL (ref 0.35–5.50)

## 2023-06-19 LAB — PSA: PSA: 0.42 ng/mL (ref 0.10–4.00)

## 2023-06-19 LAB — VITAMIN B12: Vitamin B-12: 275 pg/mL (ref 211–911)

## 2023-06-19 MED ORDER — VARENICLINE TARTRATE (STARTER) 0.5 MG X 11 & 1 MG X 42 PO TBPK: ORAL_TABLET | ORAL | 0 refills | Status: AC

## 2023-06-19 MED ORDER — ROSUVASTATIN CALCIUM 20 MG PO TABS: 20.0000 mg | ORAL_TABLET | Freq: Every day | ORAL | 3 refills | Status: DC

## 2023-06-19 MED ORDER — VARENICLINE TARTRATE 1 MG PO TABS: 1.0000 mg | ORAL_TABLET | Freq: Two times a day (BID) | ORAL | 1 refills | Status: AC

## 2023-06-19 NOTE — Progress Notes (Unsigned)
Patient ID: James Leon, male   DOB: 24-Jul-1976, 47 y.o.   MRN: [id number]         Chief Complaint:: wellness exam and right chest pain, hld, smoking       HPI:  James Leon is a 47 y.o. male here for wellness exam; decliens covid booster, o/w up to date; still smoking, not ready to quit                        Also incidentally fell playing whiffleball to the right chest 2 days ago still with marked soreness tot eh right lateral lower chest wall.  Pt denies other chest pain, increased sob or doe, wheezing, orthopnea, PND, increased LE swelling, palpitations, dizziness or syncope.  Pt denies polydipsia, polyuria, or new focal neuro s/s.    Pt denies fever, wt loss, night sweats, loss of appetite, or other constitutional symptoms     Wt Readings from Last 3 Encounters:  06/19/23 151 lb (68.5 kg)  08/11/22 153 lb (69.4 kg)  09/17/21 159 lb 8 oz (72.3 kg)   BP Readings from Last 3 Encounters:  06/19/23 120/78  08/11/22 100/60  09/17/21 (!) 145/95   Immunization History  Administered Date(s) Administered   Influenza Whole 07/05/2010   Influenza, Seasonal, Injecte, Preservative Fre 06/19/2023   Influenza,inj,Quad PF,6+ Mos 04/27/2015, 05/06/2016, 06/21/2017, 03/13/2020, 08/11/2022   PFIZER(Purple Top)SARS-COV-2 Vaccination 10/17/2019, 11/13/2019, 07/02/2020   Td 07/05/2010   Tdap 03/19/2021   Health Maintenance Due  Topic Date Due   COVID-19 Vaccine (4 - 2023-24 season) 03/26/2023      Past Medical History:  Diagnosis Date   Allergy    Anxiety    GERD (gastroesophageal reflux disease)    Hyperlipidemia    Hypertension    Past Surgical History:  Procedure Laterality Date   LUMP REMOVED FROM CHEEK Right    1 1/2 YEAR AGO AS OF 09/03/21    reports that he has been smoking cigarettes. He has never used smokeless tobacco. He reports current alcohol use. He reports current drug use. Drug: Marijuana. family history includes Depression in his brother and father; Heart disease in his  father; Hyperlipidemia in his father and mother; Hypertension in his mother; Prostate cancer in his paternal grandfather. No Known Allergies Current Outpatient Medications on File Prior to Visit  Medication Sig Dispense Refill   ALPRAZolam (XANAX) 1 MG tablet TAKE 1 TABLET BY MOUTH TWICE A DAY AS NEEDED 60 tablet 2   cetirizine (ZYRTEC) 10 MG tablet TAKE ONE TABLET BY MOUTH DAILY 30 tablet 11   Cholecalciferol 50 MCG (2000 UT) TABS 1 tab by mouth once daily 30 tablet 99   famotidine (PEPCID) 20 MG tablet 1 tab by mouth twice per day as needed 180 tablet 3   Multiple Vitamin (MULTIVITAMIN) tablet Take 1 tablet by mouth daily.     OVER THE COUNTER MEDICATION as needed. TIGER BOMB TOPICAL     traMADol (ULTRAM) 50 MG tablet TAKE 1 TABLET BY MOUTH 2 TIMES A DAY AS NEEDED FOR PAIN 60 tablet 2   No current facility-administered medications on file prior to visit.        ROS:  All others reviewed and negative.  Objective        PE:  BP 120/78 (BP Location: Left Arm, Patient Position: Sitting, Cuff Size: Normal)   Pulse 63   Temp 98.2 F (36.8 C) (Oral)   Ht 5\' 9"  (1.753 m)   Wt 151 lb (  68.5 kg)   SpO2 98%   BMI 22.30 kg/m                 Constitutional: Pt appears in NAD               HENT: Head: NCAT.                Right Ear: External ear normal.                 Left Ear: External ear normal.                Eyes: . Pupils are equal, round, and reactive to light. Conjunctivae and EOM are normal               Nose: without d/c or deformity               Neck: Neck supple. Gross normal ROM               Cardiovascular: Normal rate and regular rhythm.                 Pulmonary/Chest: Effort normal and breath sounds without rales or wheezing. ; with right lateral lower anterior chest wall mod tender without bruising or rash               Abd:  Soft, NT, ND, + BS, no organomegaly               Neurological: Pt is alert. At baseline orientation, motor grossly intact               Skin: Skin  is warm. No rashes, no other new lesions, LE edema - none               Psychiatric: Pt behavior is normal without agitation   Micro: none  Cardiac tracings I have personally interpreted today:  none  Pertinent Radiological findings (summarize): none   Lab Results  Component Value Date   WBC 9.8 06/19/2023   HGB 15.5 06/19/2023   HCT 47.5 06/19/2023   PLT 238.0 06/19/2023   GLUCOSE 79 06/19/2023   CHOL 248 (H) 06/19/2023   TRIG 102.0 06/19/2023   HDL 66.60 06/19/2023   LDLDIRECT 126.6 07/05/2010   LDLCALC 161 (H) 06/19/2023   ALT 15 06/19/2023   AST 17 06/19/2023   NA 139 06/19/2023   K 4.2 06/19/2023   CL 98 06/19/2023   CREATININE 1.01 06/19/2023   BUN 11 06/19/2023   CO2 29 06/19/2023   TSH 2.74 06/19/2023   PSA 0.42 06/19/2023   Assessment/Plan:  James Leon is a 47 y.o. Asian [4] White or Caucasian [1] male with  has a past medical history of Allergy, Anxiety, GERD (gastroesophageal reflux disease), Hyperlipidemia, and Hypertension.  Encounter for well adult exam with abnormal findings Age and sex appropriate education and counseling updated with regular exercise and diet Referrals for preventative services - none needed Immunizations addressed - declines covid booster Smoking counseling  - pt counsled to quit, pt not ready Evidence for depression or other mood disorder - none significant Most recent labs reviewed. I have personally reviewed and have noted: 1) the patient's medical and social history 2) The patient's current medications and supplements 3) The patient's height, weight, and BMI have been recorded in the chart   Vitamin D deficiency Last vitamin D Lab Results  Component Value Date   VD25OH 25.59 (L) 06/19/2023   Low, to start oral  replacement   TOBACCO USE Pt counsled to quit, pt for chantix start asd  HLD (hyperlipidemia) Lab Results  Component Value Date   LDLCALC 161 (H) 06/19/2023   uncontrolled, pt to restart crestor 20 every  day, consider ct cardiac score   Chest pain C/w msk contusion strain - declines cxr or rib films, for tylenol advil prn,  to f/u any worsening symptoms or concerns  Followup: Return in about 1 year (around 06/18/2024).  James Barre, MD 06/20/2023 9:24 PM [facility]

## 2023-06-19 NOTE — Patient Instructions (Signed)
Please take all new medication as prescribed - the chantix  Please continue all other medications as before, and refills have been done if requested.  Please have the pharmacy call with any other refills you may need.  Please continue your efforts at being more active, low cholesterol diet, and weight control.  You are otherwise up to date with prevention measures today.  Please keep your appointments with your specialists as you may have planned  Please call if you would want the chest xray, or the Cardiac CT score testing  Please go to the LAB at the blood drawing area for the tests to be done  You will be contacted by phone if any changes need to be made immediately.  Otherwise, you will receive a letter about your results with an explanation, but please check with MyChart first.  Please make an Appointment to return for your 1 year visit, or sooner if needed

## 2023-06-20 ENCOUNTER — Encounter: Payer: Self-pay | Admitting: Internal Medicine

## 2023-06-20 DIAGNOSIS — R079 Chest pain, unspecified: Secondary | ICD-10-CM | POA: Insufficient documentation

## 2023-06-20 LAB — CBC WITH DIFFERENTIAL/PLATELET
Basophils Absolute: 0.1 10*3/uL (ref 0.0–0.1)
Basophils Relative: 1.1 % (ref 0.0–3.0)
Eosinophils Absolute: 0.2 10*3/uL (ref 0.0–0.7)
Eosinophils Relative: 1.8 % (ref 0.0–5.0)
HCT: 47.5 % (ref 39.0–52.0)
Hemoglobin: 15.5 g/dL (ref 13.0–17.0)
Lymphocytes Relative: 37 % (ref 12.0–46.0)
Lymphs Abs: 3.6 10*3/uL (ref 0.7–4.0)
MCHC: 32.6 g/dL (ref 30.0–36.0)
MCV: 95.4 fL (ref 78.0–100.0)
Monocytes Absolute: 0.9 10*3/uL (ref 0.1–1.0)
Monocytes Relative: 8.8 % (ref 3.0–12.0)
Neutro Abs: 5 10*3/uL (ref 1.4–7.7)
Neutrophils Relative %: 51.3 % (ref 43.0–77.0)
Platelets: 238 10*3/uL (ref 150.0–400.0)
RBC: 4.98 Mil/uL (ref 4.22–5.81)
RDW: 14.2 % (ref 11.5–15.5)
WBC: 9.8 10*3/uL (ref 4.0–10.5)

## 2023-06-20 NOTE — Assessment & Plan Note (Addendum)
Lab Results  Component Value Date   LDLCALC 161 (H) 06/19/2023   uncontrolled, pt to restart crestor 20 every day, consider ct cardiac score

## 2023-06-20 NOTE — Assessment & Plan Note (Signed)
Last vitamin D Lab Results  Component Value Date   VD25OH 25.59 (L) 06/19/2023   Low, to start oral replacement

## 2023-06-20 NOTE — Assessment & Plan Note (Signed)
Age and sex appropriate education and counseling updated with regular exercise and diet Referrals for preventative services - none needed Immunizations addressed - declines covid booster Smoking counseling  - pt counsled to quit, pt not ready Evidence for depression or other mood disorder - none significant Most recent labs reviewed. I have personally reviewed and have noted: 1) the patient's medical and social history 2) The patient's current medications and supplements 3) The patient's height, weight, and BMI have been recorded in the chart

## 2023-06-20 NOTE — Assessment & Plan Note (Addendum)
Pt counsled to quit, pt for chantix start asd

## 2023-06-20 NOTE — Assessment & Plan Note (Signed)
C/w msk contusion strain - declines cxr or rib films, for tylenol advil prn,  to f/u any worsening symptoms or concerns

## 2023-08-14 ENCOUNTER — Other Ambulatory Visit: Payer: Self-pay | Admitting: Internal Medicine

## 2023-08-31 ENCOUNTER — Other Ambulatory Visit: Payer: Self-pay | Admitting: Internal Medicine

## 2023-11-06 ENCOUNTER — Other Ambulatory Visit: Payer: Self-pay | Admitting: Internal Medicine

## 2023-11-07 ENCOUNTER — Encounter: Payer: Self-pay | Admitting: Internal Medicine

## 2023-11-07 MED ORDER — AZITHROMYCIN 250 MG PO TABS
ORAL_TABLET | ORAL | 1 refills | Status: AC
Start: 2023-11-07 — End: 2023-11-12

## 2023-11-26 ENCOUNTER — Other Ambulatory Visit: Payer: Self-pay | Admitting: Internal Medicine

## 2024-01-30 ENCOUNTER — Other Ambulatory Visit: Payer: Self-pay | Admitting: Internal Medicine

## 2024-02-21 ENCOUNTER — Other Ambulatory Visit: Payer: Self-pay | Admitting: Internal Medicine

## 2024-04-22 ENCOUNTER — Other Ambulatory Visit: Payer: Self-pay | Admitting: Internal Medicine

## 2024-05-22 ENCOUNTER — Other Ambulatory Visit: Payer: Self-pay | Admitting: Internal Medicine

## 2024-06-15 ENCOUNTER — Other Ambulatory Visit: Payer: Self-pay | Admitting: Internal Medicine

## 2024-06-17 ENCOUNTER — Other Ambulatory Visit: Payer: Self-pay

## 2024-07-15 ENCOUNTER — Other Ambulatory Visit: Payer: Self-pay | Admitting: Internal Medicine

## 2024-08-27 ENCOUNTER — Other Ambulatory Visit: Payer: Self-pay | Admitting: Internal Medicine
